# Patient Record
Sex: Male | Born: 1935 | Race: White | Hispanic: No | Marital: Married | Smoking: Never smoker
Health system: Southern US, Community
[De-identification: ages and names within clinical notes are randomized; demographics above are authoritative.]

## PROBLEM LIST (undated history)

## (undated) DIAGNOSIS — E782 Mixed hyperlipidemia: Secondary | ICD-10-CM

## (undated) DIAGNOSIS — M869 Osteomyelitis, unspecified: Secondary | ICD-10-CM

## (undated) DIAGNOSIS — I472 Ventricular tachycardia, unspecified: Secondary | ICD-10-CM

## (undated) DIAGNOSIS — I219 Acute myocardial infarction, unspecified: Secondary | ICD-10-CM

## (undated) DIAGNOSIS — E7800 Pure hypercholesterolemia, unspecified: Secondary | ICD-10-CM

## (undated) DIAGNOSIS — I10 Essential (primary) hypertension: Secondary | ICD-10-CM

## (undated) DIAGNOSIS — M86669 Other chronic osteomyelitis, unspecified tibia and fibula: Secondary | ICD-10-CM

## (undated) DIAGNOSIS — Z951 Presence of aortocoronary bypass graft: Secondary | ICD-10-CM

## (undated) DIAGNOSIS — E119 Type 2 diabetes mellitus without complications: Secondary | ICD-10-CM

## (undated) DIAGNOSIS — I255 Ischemic cardiomyopathy: Secondary | ICD-10-CM

## (undated) DIAGNOSIS — I350 Nonrheumatic aortic (valve) stenosis: Secondary | ICD-10-CM

## (undated) HISTORY — DX: Ischemic cardiomyopathy: I25.5

## (undated) HISTORY — DX: Acute myocardial infarction, unspecified: I21.9

## (undated) HISTORY — DX: Osteomyelitis, unspecified: M86.9

## (undated) HISTORY — DX: Ventricular tachycardia, unspecified: I47.20

## (undated) HISTORY — DX: Nonrheumatic aortic (valve) stenosis: I35.0

## (undated) HISTORY — DX: Ventricular tachycardia: I47.2

## (undated) HISTORY — DX: Other chronic osteomyelitis, unspecified tibia and fibula: M86.669

## (undated) HISTORY — DX: Essential (primary) hypertension: I10

## (undated) HISTORY — PX: FOOT AMPUTATION THROUGH METATARSAL: SHX644

## (undated) HISTORY — DX: Presence of aortocoronary bypass graft: Z95.1

## (undated) HISTORY — DX: Mixed hyperlipidemia: E78.2

## (undated) HISTORY — DX: Type 2 diabetes mellitus without complications: E11.9

## (undated) HISTORY — DX: Pure hypercholesterolemia, unspecified: E78.00

---

## 1998-04-23 HISTORY — PX: CORONARY ARTERY BYPASS GRAFT: SHX141

## 1998-09-07 ENCOUNTER — Inpatient Hospital Stay (HOSPITAL_COMMUNITY): Admission: AD | Admit: 1998-09-07 | Discharge: 1998-09-16 | Payer: Self-pay | Admitting: Cardiovascular Disease

## 1998-09-12 ENCOUNTER — Encounter: Payer: Self-pay | Admitting: Cardiothoracic Surgery

## 1998-09-13 ENCOUNTER — Encounter: Payer: Self-pay | Admitting: Cardiothoracic Surgery

## 1998-09-14 ENCOUNTER — Encounter: Payer: Self-pay | Admitting: Cardiothoracic Surgery

## 2001-05-24 HISTORY — PX: CARDIAC CATHETERIZATION: SHX172

## 2001-06-06 ENCOUNTER — Ambulatory Visit (HOSPITAL_COMMUNITY): Admission: RE | Admit: 2001-06-06 | Discharge: 2001-06-07 | Payer: Self-pay | Admitting: Cardiovascular Disease

## 2004-07-28 ENCOUNTER — Ambulatory Visit: Payer: Self-pay | Admitting: Cardiovascular Disease

## 2004-08-11 ENCOUNTER — Ambulatory Visit: Payer: Self-pay

## 2005-03-01 ENCOUNTER — Ambulatory Visit: Payer: Self-pay | Admitting: Infectious Diseases

## 2005-03-01 ENCOUNTER — Inpatient Hospital Stay (HOSPITAL_COMMUNITY): Admission: EM | Admit: 2005-03-01 | Discharge: 2005-03-08 | Payer: Self-pay | Admitting: Emergency Medicine

## 2005-03-02 ENCOUNTER — Ambulatory Visit: Payer: Self-pay | Admitting: Internal Medicine

## 2005-03-09 ENCOUNTER — Encounter (HOSPITAL_BASED_OUTPATIENT_CLINIC_OR_DEPARTMENT_OTHER): Admission: RE | Admit: 2005-03-09 | Discharge: 2005-04-20 | Payer: Self-pay | Admitting: Surgery

## 2005-04-20 ENCOUNTER — Encounter (HOSPITAL_BASED_OUTPATIENT_CLINIC_OR_DEPARTMENT_OTHER): Admission: RE | Admit: 2005-04-20 | Discharge: 2005-05-09 | Payer: Self-pay | Admitting: Surgery

## 2005-05-10 ENCOUNTER — Encounter (HOSPITAL_BASED_OUTPATIENT_CLINIC_OR_DEPARTMENT_OTHER): Admission: RE | Admit: 2005-05-10 | Discharge: 2005-06-26 | Payer: Self-pay | Admitting: Surgery

## 2007-01-28 ENCOUNTER — Encounter: Admission: RE | Admit: 2007-01-28 | Discharge: 2007-01-28 | Payer: Self-pay | Admitting: Family Medicine

## 2007-02-05 ENCOUNTER — Encounter: Admission: RE | Admit: 2007-02-05 | Discharge: 2007-02-05 | Payer: Self-pay | Admitting: Family Medicine

## 2008-05-15 ENCOUNTER — Emergency Department (HOSPITAL_COMMUNITY): Admission: EM | Admit: 2008-05-15 | Discharge: 2008-05-15 | Payer: Self-pay | Admitting: Emergency Medicine

## 2008-05-20 ENCOUNTER — Encounter: Admission: RE | Admit: 2008-05-20 | Discharge: 2008-05-20 | Payer: Self-pay | Admitting: Orthopedic Surgery

## 2008-06-01 ENCOUNTER — Ambulatory Visit: Payer: Self-pay | Admitting: Cardiology

## 2008-06-01 ENCOUNTER — Inpatient Hospital Stay (HOSPITAL_COMMUNITY): Admission: AD | Admit: 2008-06-01 | Discharge: 2008-06-07 | Payer: Self-pay | Admitting: Orthopedic Surgery

## 2008-06-04 ENCOUNTER — Ambulatory Visit (HOSPITAL_BASED_OUTPATIENT_CLINIC_OR_DEPARTMENT_OTHER): Admission: RE | Admit: 2008-06-04 | Discharge: 2008-06-04 | Payer: Self-pay | Admitting: Orthopedic Surgery

## 2008-06-04 ENCOUNTER — Encounter (INDEPENDENT_AMBULATORY_CARE_PROVIDER_SITE_OTHER): Payer: Self-pay | Admitting: Orthopedic Surgery

## 2008-06-07 ENCOUNTER — Encounter (INDEPENDENT_AMBULATORY_CARE_PROVIDER_SITE_OTHER): Payer: Self-pay | Admitting: Orthopedic Surgery

## 2008-06-15 ENCOUNTER — Encounter (HOSPITAL_BASED_OUTPATIENT_CLINIC_OR_DEPARTMENT_OTHER): Admission: RE | Admit: 2008-06-15 | Discharge: 2008-09-13 | Payer: Self-pay | Admitting: General Surgery

## 2008-06-29 ENCOUNTER — Encounter: Payer: Self-pay | Admitting: Cardiovascular Disease

## 2008-06-29 DIAGNOSIS — I2589 Other forms of chronic ischemic heart disease: Secondary | ICD-10-CM

## 2008-06-29 DIAGNOSIS — M86669 Other chronic osteomyelitis, unspecified tibia and fibula: Secondary | ICD-10-CM | POA: Insufficient documentation

## 2008-06-29 DIAGNOSIS — Z951 Presence of aortocoronary bypass graft: Secondary | ICD-10-CM | POA: Insufficient documentation

## 2008-06-29 DIAGNOSIS — I359 Nonrheumatic aortic valve disorder, unspecified: Secondary | ICD-10-CM | POA: Insufficient documentation

## 2008-06-29 DIAGNOSIS — I472 Ventricular tachycardia: Secondary | ICD-10-CM

## 2008-06-29 DIAGNOSIS — E785 Hyperlipidemia, unspecified: Secondary | ICD-10-CM

## 2008-07-01 ENCOUNTER — Ambulatory Visit: Payer: Self-pay | Admitting: Cardiovascular Disease

## 2008-07-16 ENCOUNTER — Ambulatory Visit: Payer: Self-pay

## 2008-08-31 ENCOUNTER — Telehealth (INDEPENDENT_AMBULATORY_CARE_PROVIDER_SITE_OTHER): Payer: Self-pay | Admitting: *Deleted

## 2008-09-01 ENCOUNTER — Encounter (INDEPENDENT_AMBULATORY_CARE_PROVIDER_SITE_OTHER): Payer: Self-pay | Admitting: Orthopedic Surgery

## 2008-09-01 ENCOUNTER — Inpatient Hospital Stay (HOSPITAL_COMMUNITY): Admission: RE | Admit: 2008-09-01 | Discharge: 2008-09-04 | Payer: Self-pay | Admitting: Orthopedic Surgery

## 2009-05-18 ENCOUNTER — Encounter (INDEPENDENT_AMBULATORY_CARE_PROVIDER_SITE_OTHER): Payer: Self-pay | Admitting: *Deleted

## 2009-07-18 DIAGNOSIS — I219 Acute myocardial infarction, unspecified: Secondary | ICD-10-CM | POA: Insufficient documentation

## 2009-07-18 DIAGNOSIS — I10 Essential (primary) hypertension: Secondary | ICD-10-CM | POA: Insufficient documentation

## 2009-07-18 DIAGNOSIS — E7800 Pure hypercholesterolemia, unspecified: Secondary | ICD-10-CM

## 2009-07-18 DIAGNOSIS — E119 Type 2 diabetes mellitus without complications: Secondary | ICD-10-CM | POA: Insufficient documentation

## 2009-07-18 DIAGNOSIS — M869 Osteomyelitis, unspecified: Secondary | ICD-10-CM | POA: Insufficient documentation

## 2009-07-19 ENCOUNTER — Ambulatory Visit: Payer: Self-pay | Admitting: Cardiovascular Disease

## 2009-07-19 DIAGNOSIS — R0989 Other specified symptoms and signs involving the circulatory and respiratory systems: Secondary | ICD-10-CM

## 2009-08-05 ENCOUNTER — Ambulatory Visit: Payer: Self-pay

## 2009-08-05 ENCOUNTER — Encounter: Payer: Self-pay | Admitting: Cardiovascular Disease

## 2009-08-15 ENCOUNTER — Encounter (INDEPENDENT_AMBULATORY_CARE_PROVIDER_SITE_OTHER): Payer: Self-pay | Admitting: *Deleted

## 2009-10-11 ENCOUNTER — Telehealth: Payer: Self-pay | Admitting: Cardiovascular Disease

## 2009-10-12 ENCOUNTER — Ambulatory Visit: Payer: Self-pay | Admitting: Cardiovascular Disease

## 2010-04-06 ENCOUNTER — Ambulatory Visit: Payer: Self-pay | Admitting: Cardiovascular Disease

## 2010-05-25 NOTE — Assessment & Plan Note (Signed)
Summary: 6 mo f/u ./cy   Visit Type:  6 months follow up Primary Provider:  Dr. Lemar Wilson   History of Present Illness: Antonio Wilson is seen today for F/U of CAD with prev. CABG in 2000.  Known occlusion of RCA graft with stenting of the native distal RCA in 2010.  Also history of mild AS and left carotid bruit.  CRF's include elevated lipids and HTN.  No cardiac complaints.  Recent bilateral toe surgery by Dr Antonio Wilson.  Denies SSCP, palpitations, dyspnea or syncope.  Compliant with meds.  Needs f/U carotid duplex in 6 months. His EF has been hard to gage by echo but is likely 30-35%.  He has been euvolemic with no CHF  He is very depressed as his invalid wife has recently passed and frankly I don't think Antonio Wilson knows what to do with himself  Current Problems (verified): 1)  Carotid Bruit  (ICD-785.9) 2)  Ventricular Tachycardia  (ICD-427.1) 3)  Coronary Artery Bypass Graft, Hx of  (ICD-V45.81) 4)  Aortic Stenosis, Mild  (ICD-424.1) 5)  Cardiomyopathy, Ischemic  (ICD-414.8) 6)  Hyperlipidemia-mixed  (ICD-272.4) 7)  Hypercholesterolemia  (ICD-272.0) 8)  Myocardial Infarction  (ICD-410.90) 9)  Hypertension  (ICD-401.9) 10)  Dm  (ICD-250.00) 11)  Chronic Osteomyelitis, Lower Leg  (ICD-730.16) 12)  Osteomyelitis  (ICD-730.20)  Current Medications (verified): 1)  Altace 5 Mg Caps (Ramipril) .Marland Kitchen.. 1 Tab Daily 2)  Coreg 3.125 Mg Tabs (Carvedilol) .Marland Kitchen.. 1 Tab Two Times A Day 3)  Glimepiride 4 Mg Tabs (Glimepiride) .... Take 1 Tablet By Mouth Once A Day 4)  Lipitor 20 Mg Tabs (Atorvastatin Calcium) .Marland Kitchen.. 1 Tab At Bedtime 5)  Aspirin 81 Mg Tbec (Aspirin) .... Take One Tablet By Mouth Daily 6)  Metformin Hcl 500 Mg Xr24h-Tab (Metformin Hcl) .... Take 1 Tablet By Mouth Two Times A Day 7)  Fish Oil 500 Mg Caps (Omega-3 Fatty Acids) .... Take 1 Capsule By Mouth Two Times A Day 8)  Valacyclovir Hcl 500 Mg Tabs (Valacyclovir Hcl) .... One Tab By Mouth Once Daily 9)  Flax  Oil (Flaxseed (Linseed)) .... 2400mg   Daily 10)  Saw Palmetto 450 Mg Caps (Saw Palmetto (Serenoa Repens)) .... Take 1 Capsule By Mouth Once A Day 11)  Garlic Oil 1000 Mg Caps (Garlic) .... Take 1 Capsule By Mouth Once A Day 12)  Cvs B-Carotene [address] Unit Caps (Beta Carotene) .... Take 1 Capsule By Mouth Once A Day  Allergies (verified): No Known Drug Allergies  Past History:  Past Medical History: Last updated: 07/18/2009 Current Problems:  VENTRICULAR TACHYCARDIA (ICD-427.1) CORONARY ARTERY BYPASS GRAFT, HX OF (ICD-V45.81) AORTIC STENOSIS, MILD (ICD-424.1) CARDIOMYOPATHY, ISCHEMIC (ICD-414.8) HYPERLIPIDEMIA-MIXED (ICD-272.4) HYPERCHOLESTEROLEMIA (ICD-272.0) MYOCARDIAL INFARCTION (ICD-410.90) HYPERTENSION (ICD-401.9) DM (ICD-250.00) CHRONIC OSTEOMYELITIS, LOWER LEG (ICD-730.16) OSTEOMYELITIS (ICD-730.20)of right great toe  CAD/CABG:  08/1998 SVG D1, SVG OM, SVG RCA occluded by cath 2003 with stent native RCA Lima LAD Iscehmis cardiomyopathy  Past Surgical History: Last updated: 07/18/2009 CABG 2000  by Antonio Wilson heart cath 2/03 Transmetatarsal amputation.   Family History: Last updated: 07/18/08 Father:decease ca Mother:decease old age Siblings:unknown  Social History: Last updated: 07-18-08 Retired  Married wife lives in rest home Tobacco Use - No.  Alcohol Use - no Drug Use - no  Review of Systems       Denies fever, malais, weight loss, blurry vision, decreased visual acuity, cough, sputum, SOB, hemoptysis, pleuritic pain, palpitaitons, heartburn, abdominal pain, melena, lower extremity edema, claudication, or rash.   Vital Signs:  Patient profile:  77 year old male Height:      71 inches Weight:      165 pounds BMI:     23.10 Pulse rate:   60 / minute Pulse rhythm:   irregular Resp:     18 per minute BP sitting:   110 / 60  (left arm) Cuff size:   regular  Vitals Entered By: Antonio Goody, RN (April 06, 2010 10:11 AM)  Physical Exam  General:  Affect appropriate Healthy:   appears stated age HEENT: normal Neck supple with no adenopathy JVP normalbilateral bruits no thyromegaly Lungs clear with no wheezing and good diaphragmatic motion Heart:  S1/S2 mild AS  murmur,rub, gallop or click PMI normal Abdomen: benighn, BS positve, no tenderness, no AAA no bruit.  No HSM or HJR Distal pulses intact with no bruits No edema Neuro non-focal Skin warm and dry    Impression & Recommendations:  Problem # 1:  CAROTID BRUIT (ICD-785.9) Duplex in 6 months.  No TIA like symptoms  Problem # 2:  CORONARY ARTERY BYPASS GRAFT, HX OF (ICD-V45.81) Stable no angina  Problem # 3:  AORTIC STENOSIS, MILD (ICD-424.1) Consider F/U echo in a year Asymptomatic and not severe on exam His updated medication list for this problem includes:    Altace 5 Mg Caps (Ramipril) .Marland Kitchen... 1 tab daily    Coreg 3.125 Mg Tabs (Carvedilol) .Marland Kitchen... 1 tab two times a day  Problem # 4:  CARDIOMYOPATHY, ISCHEMIC (ICD-414.8) Euvolemic functional class 2.  Orthopedic and neuro limitations to ambulation more than breathing His updated medication list for this problem includes:    Altace 5 Mg Caps (Ramipril) .Marland Kitchen... 1 tab daily    Coreg 3.125 Mg Tabs (Carvedilol) .Marland Kitchen... 1 tab two times a day    Aspirin 81 Mg Tbec (Aspirin) .Marland Kitchen... Take one tablet by mouth daily  Patient Instructions: 1)  Your physician wants you to follow-up in:6 MONTHS   You will receive a reminder letter in the mail two months in advance. If you don't receive a letter, please call our office to schedule the follow-up appointment.

## 2010-05-25 NOTE — Progress Notes (Signed)
Summary: Tingling in hands and legs  Phone Note Call from Patient Call back at Home Phone [phone]   Caller: Patient Summary of Call: Pt having tingling in hands and legs Initial call taken by: Judie Grieve,  October 11, 2009 1:35 PM  Follow-up for Phone Call        Pt awoke this am with tingling in hands and feet. This lasted a few minutes and he has had no more episodes.  Wants to see Dr. Eden Emms to discuss this.  Appt. made for June 22, 11 at 10:45 AM. Lisabeth Devoid RN

## 2010-05-25 NOTE — Assessment & Plan Note (Signed)
Summary: rov/dfg   Primary Provider:  Dr. Lemar Livings  CC:  pt complains of numbness in hands and pt states he couldnot get out the bed because of dizziness.  History of Present Illness: Antonio Wilson is seen today for F/U of CAD with prev. CABG in 2000.  Known occlusion of RCA graft with stenting of the native distal RCA in 2010.  Also history of mild AS and left carotid bruit.  CRF's include elevated lipids and HTN.  No cardiac complaints.  Recent bilateral toe surgery by Dr Luiz Blare.  Denies SSCP, palpitations, dyspnea or syncope.  Compliant with meds.  Needs f/U carotid duplex in 6 months. His EF has been hard to gage by echo but is likely 30-35%.  He has been euvolemic with no CHF He is very anxious and cares for his invalid wife.  He has had some lower extremithy parasthesias and may have some lumbar disc problems.  He just had a carotid duplex for left bruit and has no significant ICA stenosis.  He had tingling in his upper extremities with no other focal neuro signs.  I reassured him that he was not having a stroke and told him to F/U with Dr Nathanial Rancher for his back and legs  Current Problems (verified): 1)  Carotid Bruit  (ICD-785.9) 2)  Ventricular Tachycardia  (ICD-427.1) 3)  Coronary Artery Bypass Graft, Hx of  (ICD-V45.81) 4)  Aortic Stenosis, Mild  (ICD-424.1) 5)  Cardiomyopathy, Ischemic  (ICD-414.8) 6)  Hyperlipidemia-mixed  (ICD-272.4) 7)  Hypercholesterolemia  (ICD-272.0) 8)  Myocardial Infarction  (ICD-410.90) 9)  Hypertension  (ICD-401.9) 10)  Dm  (ICD-250.00) 11)  Chronic Osteomyelitis, Lower Leg  (ICD-730.16) 12)  Osteomyelitis  (ICD-730.20)  Current Medications (verified): 1)  Altace 5 Mg Caps (Ramipril) .Marland Kitchen.. 1 Tab Daily 2)  Coreg 3.125 Mg Tabs (Carvedilol) .Marland Kitchen.. 1 Tab Two Times A Day 3)  Glimepiride 4 Mg Tabs (Glimepiride) .... Take 1 Tablet By Mouth Once A Day 4)  Lipitor 20 Mg Tabs (Atorvastatin Calcium) .Marland Kitchen.. 1 Tab At Bedtime 5)  Aspirin 81 Mg Tbec (Aspirin) .... Take One Tablet  By Mouth Daily 6)  Metformin Hcl 500 Mg Xr24h-Tab (Metformin Hcl) .... Take 1 Tablet By Mouth Two Times A Day 7)  Fish Oil 500 Mg Caps (Omega-3 Fatty Acids) .... Take 1 Capsule By Mouth Two Times A Day 8)  Multivitamins  Tabs (Multiple Vitamin) .... Take 1 Tablet By Mouth Once A Day  Allergies (verified): No Known Drug Allergies  Past History:  Past Medical History: Last updated: 07/18/2009 Current Problems:  VENTRICULAR TACHYCARDIA (ICD-427.1) CORONARY ARTERY BYPASS GRAFT, HX OF (ICD-V45.81) AORTIC STENOSIS, MILD (ICD-424.1) CARDIOMYOPATHY, ISCHEMIC (ICD-414.8) HYPERLIPIDEMIA-MIXED (ICD-272.4) HYPERCHOLESTEROLEMIA (ICD-272.0) MYOCARDIAL INFARCTION (ICD-410.90) HYPERTENSION (ICD-401.9) DM (ICD-250.00) CHRONIC OSTEOMYELITIS, LOWER LEG (ICD-730.16) OSTEOMYELITIS (ICD-730.20)of right great toe  CAD/CABG:  08/1998 SVG D1, SVG OM, SVG RCA occluded by cath 2003 with stent native RCA Lima LAD Iscehmis cardiomyopathy  Past Surgical History: Last updated: 07/18/2009 CABG 2000  by Zenaida Niece Trigt heart cath 2/03 Transmetatarsal amputation.   Family History: Last updated: 07/08/2008 Father:decease ca Mother:decease old age Siblings:unknown  Social History: Last updated: 2008-07-08 Retired  Married wife lives in rest home Tobacco Use - No.  Alcohol Use - no Drug Use - no  Review of Systems       Denies fever, malais, weight loss, blurry vision, decreased visual acuity, cough, sputum, SOB, hemoptysis, pleuritic pain, palpitaitons, heartburn, abdominal pain, melena, lower extremity edema, claudication, or rash.   Vital Signs:  Patient profile:  77 year old male Height:      71 inches Weight:      159 pounds BMI:     22.26 Pulse rate:   60 / minute Resp:     18 per minute BP sitting:   118 / 67  (left arm)  Vitals Entered By: Kem Parkinson (October 12, 2009 10:42 AM)  Physical Exam  General:  Affect appropriate Healthy:  appears stated age HEENT: normal Neck supple  with no adenopathy JVP normal left  bruits no thyromegaly Lungs clear with no wheezing and good diaphragmatic motion Heart:  S1/S2 systolic  murmur, no rub, gallop or click PMI normal Abdomen: benighn, BS positve, no tenderness, no AAA no bruit.  No HSM or HJR Distal pulses intact with no bruits No edema Neuro non-focal Skin warm and dry    Impression & Recommendations:  Problem # 1:  CAROTID BRUIT (ICD-785.9) No stenosis by duplex continue ASA.  Upper arm tingling not likely TIA.  No further w.u  Problem # 2:  CORONARY ARTERY BYPASS GRAFT, HX OF (ICD-V45.81) Stable no angina  Problem # 3:  AORTIC STENOSIS, MILD (ICD-424.1) Mild with no recent progression.  F/U echo in a year His updated medication list for this problem includes:    Altace 5 Mg Caps (Ramipril) .Marland Kitchen... 1 tab daily    Coreg 3.125 Mg Tabs (Carvedilol) .Marland Kitchen... 1 tab two times a day  Problem # 4:  HYPERLIPIDEMIA-MIXED (ICD-272.4)  Labs with Dr Nathanial Rancher.  No myalgias His updated medication list for this problem includes:    Lipitor 20 Mg Tabs (Atorvastatin calcium) .Marland Kitchen... 1 tab at bedtime  His updated medication list for this problem includes:    Lipitor 20 Mg Tabs (Atorvastatin calcium) .Marland Kitchen... 1 tab at bedtime  Patient Instructions: 1)  Your physician recommends that you schedule a follow-up appointment in: 6 MONTHS WITH DR Eden Emms 2)  Your physician recommends that you continue on your current medications as directed. Please refer to the Current Medication list given to you today.

## 2010-05-25 NOTE — Letter (Signed)
Summary: Generic Letter  [employer], Main Office  [address]   [place], [place] [address]   Phone: [phone]  Fax: [phone]        August 15, 2009 MRN: [id number]    [employer] [address] [place], [place]  [address]    Dear Mr. Hintz,       I have been unable to reach you by phone to let you know the results of your ultrasound. We checked the arteries in your neck to make sure there was no blockage and everything is fine. You do have 0-39% blockage on both sides of your neck in the carotid arteries, but that is considered normal. We may repeat this scan in the future to make sure the blockage does not change, but for right now everything is normal. Please call with any questions or concerns.   Sincerely,  Deliah Goody, RN/Dr Charlton Haws  This letter has been electronically signed by your physician.

## 2010-05-25 NOTE — Assessment & Plan Note (Signed)
Summary: yearly/sl  Medications Added GLIMEPIRIDE 4 MG TABS (GLIMEPIRIDE) Take 1 tablet by mouth once a day ASPIRIN 81 MG TBEC (ASPIRIN) Take one tablet by mouth daily METFORMIN HCL 500 MG XR24H-TAB (METFORMIN HCL) Take 1 tablet by mouth two times a day FISH OIL 500 MG CAPS (OMEGA-3 FATTY ACIDS) Take 1 capsule by mouth two times a day MULTIVITAMINS  TABS (MULTIPLE VITAMIN) Take 1 tablet by mouth once a day      Allergies Added: NKDA  Visit Type:  1 year follow up  CC:  No complains.  History of Present Illness: Antonio Wilson is seen today for F/U of CAD with prev. CABG in 2000.  Known occlusion of RCA graft with stenting of the native distal RCA in 2010.  Also history of mild AS and left carotid bruit.  CRF's include elevated lipids and HTN.  No cardiac complaints.  Recent bilateral toe surgery by Dr Luiz Blare.  Denies SSCP, palpitations, dyspnea or syncope.  Compliant with meds.  Needs f/U carotid duplex in 6 months. His EF has been hard to gage by echo but is likely 30-35%.  He has been euvolemic with no CHF  Current Problems (verified): 1)  Carotid Bruit  (ICD-785.9) 2)  Ventricular Tachycardia  (ICD-427.1) 3)  Coronary Artery Bypass Graft, Hx of  (ICD-V45.81) 4)  Aortic Stenosis, Mild  (ICD-424.1) 5)  Cardiomyopathy, Ischemic  (ICD-414.8) 6)  Hyperlipidemia-mixed  (ICD-272.4) 7)  Hypercholesterolemia  (ICD-272.0) 8)  Myocardial Infarction  (ICD-410.90) 9)  Hypertension  (ICD-401.9) 10)  Dm  (ICD-250.00) 11)  Chronic Osteomyelitis, Lower Leg  (ICD-730.16) 12)  Osteomyelitis  (ICD-730.20)  Current Medications (verified): 1)  Altace 5 Mg Caps (Ramipril) .Marland Kitchen.. 1 Tab Daily 2)  Coreg 3.125 Mg Tabs (Carvedilol) .Marland Kitchen.. 1 Tab Two Times A Day 3)  Glimepiride 4 Mg Tabs (Glimepiride) .... Take 1 Tablet By Mouth Once A Day 4)  Lipitor 20 Mg Tabs (Atorvastatin Calcium) .Marland Kitchen.. 1 Tab At Bedtime 5)  Aspirin 81 Mg Tbec (Aspirin) .... Take One Tablet By Mouth Daily 6)  Metformin Hcl 500 Mg Xr24h-Tab  (Metformin Hcl) .... Take 1 Tablet By Mouth Two Times A Day 7)  Fish Oil 500 Mg Caps (Omega-3 Fatty Acids) .... Take 1 Capsule By Mouth Two Times A Day 8)  Multivitamins  Tabs (Multiple Vitamin) .... Take 1 Tablet By Mouth Once A Day  Allergies (verified): No Known Drug Allergies  Past History:  Past Medical History: Last updated: 07/18/2009 Current Problems:  VENTRICULAR TACHYCARDIA (ICD-427.1) CORONARY ARTERY BYPASS GRAFT, HX OF (ICD-V45.81) AORTIC STENOSIS, MILD (ICD-424.1) CARDIOMYOPATHY, ISCHEMIC (ICD-414.8) HYPERLIPIDEMIA-MIXED (ICD-272.4) HYPERCHOLESTEROLEMIA (ICD-272.0) MYOCARDIAL INFARCTION (ICD-410.90) HYPERTENSION (ICD-401.9) DM (ICD-250.00) CHRONIC OSTEOMYELITIS, LOWER LEG (ICD-730.16) OSTEOMYELITIS (ICD-730.20)of right great toe  CAD/CABG:  08/1998 SVG D1, SVG OM, SVG RCA occluded by cath 2003 with stent native RCA Lima LAD Iscehmis cardiomyopathy  Past Surgical History: Last updated: 07/18/2009 CABG 2000  by Zenaida Niece Trigt heart cath 2/03 Transmetatarsal amputation.   Family History: Last updated: 07-Jul-2008 Father:decease ca Mother:decease old age Siblings:unknown  Social History: Last updated: 2008/07/07 Retired  Married wife lives in rest home Tobacco Use - No.  Alcohol Use - no Drug Use - no  Review of Systems       Denies fever, malais, weight loss, blurry vision, decreased visual acuity, cough, sputum, SOB, hemoptysis, pleuritic pain, palpitaitons, heartburn, abdominal pain, melena, lower extremity edema, claudication, or rash.   Vital Signs:  Patient profile:   77 year old male Height:      71 inches  Weight:      166 pounds BMI:     23.24 Pulse rate:   74 / minute Pulse rhythm:   irregular Resp:     18 per minute BP sitting:   130 / 60  (left arm) Cuff size:   large  Vitals Entered By: Vikki Ports (July 19, 2009 1:41 PM)  Physical Exam  General:  Affect appropriate Healthy:  appears stated age HEENT: normal Neck supple with no  adenopathy JVP normal  bilateral  bruits no thyromegaly Lungs clear with no wheezing and good diaphragmatic motion Heart:  S1/S2 no murmur,rub, gallop or click PMI normal Abdomen: benighn, BS positve, no tenderness, no AAA no bruit.  No HSM or HJR Distal pulses intact with no bruits No edema Neuro non-focal Skin warm and dry    Impression & Recommendations:  Problem # 1:  CAROTID BRUIT (ICD-785.9) F/U duplex in 6 monnts continue antiplatlet Rx Orders: Carotid Duplex (Carotid Duplex)  Problem # 2:  CORONARY ARTERY BYPASS GRAFT, HX OF (ICD-V45.81) Stable no angina.  Continue medical Rx.  Known occlusion of RCA graft with native vessel stenting  Problem # 3:  AORTIC STENOSIS, MILD (ICD-424.1) Mild but low EF.  F/U echo in 6 months His updated medication list for this problem includes:    Altace 5 Mg Caps (Ramipril) .Marland Kitchen... 1 tab daily    Coreg 3.125 Mg Tabs (Carvedilol) .Marland Kitchen... 1 tab two times a day  Problem # 4:  HYPERLIPIDEMIA-MIXED (ICD-272.4) Labs in 6 months no myalgias His updated medication list for this problem includes:    Lipitor 20 Mg Tabs (Atorvastatin calcium) .Marland Kitchen... 1 tab at bedtime  Patient Instructions: 1)  Your physician recommends that you schedule a follow-up appointment in: 6 MONTHS 2)  Your physician has requested that you have a carotid duplex. This test is an ultrasound of the carotid arteries in your neck. It looks at blood flow through these arteries that supply the brain with blood. Allow one hour for this exam. There are no restrictions or special instructions.   EKG Report  Procedure date:  07/19/2009  Findings:      SR 74 PVC's LAD ST/T wave changes consider lateral ischemia No change from prev

## 2010-05-25 NOTE — Letter (Signed)
Summary: Appointment - Reminder 2  [employer], Main Office  [address]   [place], [place] [address]   Phone: [phone]  Fax: [phone]     May 18, 2009 MRN: [id number]   Antonio Wilson [address] [place], [place]  [address]   Dear Mr. Kreisler,  Our records indicate that it is time to schedule a follow-up appointment with Dr. Eden Emms. It is very important that we reach you to schedule this appointment. We look forward to participating in your health care needs. Please contact us at the number listed above at your earliest convenience to schedule your appointment.  If you are unable to make an appointment at this time, give [place] a call so we can update our records.   Sincerely,   [facility] Scheduling Team

## 2010-08-01 LAB — CBC
HCT: 36 % — ABNORMAL LOW (ref 39.0–52.0)
Hemoglobin: 12.2 g/dL — ABNORMAL LOW (ref 13.0–17.0)
MCHC: 33.8 g/dL (ref 30.0–36.0)
MCV: 88.8 fL (ref 78.0–100.0)
Platelets: 205 10*3/uL (ref 150–400)
RBC: 4.05 MIL/uL — ABNORMAL LOW (ref 4.22–5.81)
RDW: 14.8 % (ref 11.5–15.5)
WBC: 6.2 10*3/uL (ref 4.0–10.5)

## 2010-08-01 LAB — GLUCOSE, CAPILLARY
Glucose-Capillary: 101 mg/dL — ABNORMAL HIGH (ref 70–99)
Glucose-Capillary: 111 mg/dL — ABNORMAL HIGH (ref 70–99)
Glucose-Capillary: 118 mg/dL — ABNORMAL HIGH (ref 70–99)
Glucose-Capillary: 119 mg/dL — ABNORMAL HIGH (ref 70–99)
Glucose-Capillary: 121 mg/dL — ABNORMAL HIGH (ref 70–99)
Glucose-Capillary: 122 mg/dL — ABNORMAL HIGH (ref 70–99)
Glucose-Capillary: 136 mg/dL — ABNORMAL HIGH (ref 70–99)
Glucose-Capillary: 148 mg/dL — ABNORMAL HIGH (ref 70–99)
Glucose-Capillary: 157 mg/dL — ABNORMAL HIGH (ref 70–99)
Glucose-Capillary: 75 mg/dL (ref 70–99)
Glucose-Capillary: 89 mg/dL (ref 70–99)
Glucose-Capillary: 98 mg/dL (ref 70–99)
Glucose-Capillary: 98 mg/dL (ref 70–99)

## 2010-08-01 LAB — BASIC METABOLIC PANEL
BUN: 14 mg/dL (ref 6–23)
CO2: 28 mEq/L (ref 19–32)
Calcium: 8.6 mg/dL (ref 8.4–10.5)
Chloride: 103 mEq/L (ref 96–112)
Creatinine, Ser: 0.71 mg/dL (ref 0.4–1.5)
GFR calc Af Amer: >60 mL/min (ref >60)
GFR calc non Af Amer: >60 mL/min (ref >60)
Glucose, Bld: 101 mg/dL — ABNORMAL HIGH (ref 70–99)
Potassium: 4.6 mEq/L (ref 3.5–5.1)
Sodium: 137 mEq/L (ref 135–145)

## 2010-08-07 LAB — BASIC METABOLIC PANEL
BUN: 17 mg/dL (ref 6–23)
Chloride: 101 mEq/L (ref 96–112)
Glucose, Bld: 269 mg/dL — ABNORMAL HIGH (ref 70–99)
Potassium: 4.5 mEq/L (ref 3.5–5.1)

## 2010-08-07 LAB — POCT I-STAT, CHEM 8
Calcium, Ion: 1.1 mmol/L — ABNORMAL LOW (ref 1.12–1.32)
Chloride: 101 mEq/L (ref 96–112)
Creatinine, Ser: 1.1 mg/dL (ref 0.4–1.5)
Glucose, Bld: 160 mg/dL — ABNORMAL HIGH (ref 70–99)
Potassium: 4.5 mEq/L (ref 3.5–5.1)

## 2010-08-07 LAB — DIFFERENTIAL
Eosinophils Absolute: 0 10*3/uL (ref 0.0–0.7)
Lymphs Abs: 1.2 10*3/uL (ref 0.7–4.0)
Monocytes Absolute: 0.7 10*3/uL (ref 0.1–1.0)
Monocytes Relative: 9 % (ref 3–12)
Neutro Abs: 5.3 10*3/uL (ref 1.7–7.7)
Neutrophils Relative %: 74 % (ref 43–77)

## 2010-08-07 LAB — CBC
Hemoglobin: 12.6 g/dL — ABNORMAL LOW (ref 13.0–17.0)
MCV: 90.9 fL (ref 78.0–100.0)
RBC: 4.19 MIL/uL — ABNORMAL LOW (ref 4.22–5.81)
WBC: 7.2 10*3/uL (ref 4.0–10.5)

## 2010-08-08 LAB — GLUCOSE, CAPILLARY
Glucose-Capillary: 123 mg/dL — ABNORMAL HIGH (ref 70–99)
Glucose-Capillary: 124 mg/dL — ABNORMAL HIGH (ref 70–99)
Glucose-Capillary: 139 mg/dL — ABNORMAL HIGH (ref 70–99)
Glucose-Capillary: 149 mg/dL — ABNORMAL HIGH (ref 70–99)
Glucose-Capillary: 159 mg/dL — ABNORMAL HIGH (ref 70–99)
Glucose-Capillary: 159 mg/dL — ABNORMAL HIGH (ref 70–99)
Glucose-Capillary: 168 mg/dL — ABNORMAL HIGH (ref 70–99)
Glucose-Capillary: 179 mg/dL — ABNORMAL HIGH (ref 70–99)
Glucose-Capillary: 273 mg/dL — ABNORMAL HIGH (ref 70–99)
Glucose-Capillary: 57 mg/dL — ABNORMAL LOW (ref 70–99)
Glucose-Capillary: 93 mg/dL (ref 70–99)
Glucose-Capillary: 93 mg/dL (ref 70–99)

## 2010-08-08 LAB — CBC
HCT: 34.3 % — ABNORMAL LOW (ref 39.0–52.0)
HCT: 35.7 % — ABNORMAL LOW (ref 39.0–52.0)
HCT: 35.9 % — ABNORMAL LOW (ref 39.0–52.0)
Hemoglobin: 12.4 g/dL — ABNORMAL LOW (ref 13.0–17.0)
MCHC: 34.2 g/dL (ref 30.0–36.0)
MCHC: 34.8 g/dL (ref 30.0–36.0)
MCV: 88.7 fL (ref 78.0–100.0)
MCV: 89.3 fL (ref 78.0–100.0)
Platelets: 172 10*3/uL (ref 150–400)
Platelets: 173 10*3/uL (ref 150–400)
RBC: 3.86 MIL/uL — ABNORMAL LOW (ref 4.22–5.81)
RBC: 4.03 MIL/uL — ABNORMAL LOW (ref 4.22–5.81)
RDW: 13.6 % (ref 11.5–15.5)
WBC: 6.8 10*3/uL (ref 4.0–10.5)
WBC: 7.2 10*3/uL (ref 4.0–10.5)

## 2010-08-08 LAB — BASIC METABOLIC PANEL
BUN: 11 mg/dL (ref 6–23)
BUN: 11 mg/dL (ref 6–23)
CO2: 27 mEq/L (ref 19–32)
Chloride: 102 mEq/L (ref 96–112)
Chloride: 98 mEq/L (ref 96–112)
Creatinine, Ser: 0.73 mg/dL (ref 0.4–1.5)
Creatinine, Ser: 0.73 mg/dL (ref 0.4–1.5)
GFR calc Af Amer: >60 mL/min (ref >60)
GFR calc non Af Amer: >60 mL/min (ref >60)
Glucose, Bld: 153 mg/dL — ABNORMAL HIGH (ref 70–99)
Potassium: 4 mEq/L (ref 3.5–5.1)
Potassium: 4.1 mEq/L (ref 3.5–5.1)
Potassium: 4.4 mEq/L (ref 3.5–5.1)
Sodium: 129 mEq/L — ABNORMAL LOW (ref 135–145)

## 2010-08-08 LAB — WOUND CULTURE

## 2010-08-08 LAB — BRAIN NATRIURETIC PEPTIDE: Pro B Natriuretic peptide (BNP): 314 pg/mL — ABNORMAL HIGH (ref 0.0–100.0)

## 2010-08-08 LAB — SEDIMENTATION RATE: Sed Rate: 53 mm/hr — ABNORMAL HIGH (ref 0–16)

## 2010-09-05 NOTE — Consult Note (Signed)
NAME:  Antonio Wilson, Antonio Wilson NO.:  [id number]   MEDICAL RECORD NO.:  [id number]          PATIENT TYPE:  REC   LOCATION:  FOOT                         FACILITY:  [facility]   PHYSICIAN:  Jonelle Sports. Sevier, M.D. DATE OF BIRTH:  [birth year]   DATE OF CONSULTATION:  06/16/2008  DATE OF DISCHARGE:                                 CONSULTATION   HISTORY OF PRESENT ILLNESS:  This is a 77 year old white male who has  previously been seen at this clinic.  He has type 2 diabetes and  significant coronary disease.   He had developed an infection in his right hallux with penetration down  to the bone and with osteomyelitis in that toe as well as possibly in  the MP joint.  This led to his hospitalization and preparation with  antibiotics and so forth leading up to eventual ray amputation of the  first ray of the right lower extremity on June 04, 2008.   Since that time, he has remained on Augmentin XR and has had a  reasonably satisfactory progressive healing of the wound.   He is referred here apparently for our ongoing management in  anticipation of the fact that this wound will take quite a while  completely to heal.   PAST MEDICAL HISTORY:  Surgeries include coronary artery bypass grafting  in 2000 and the surgery alluded in the present illness.  He has had  numerous other hospitalizations in association with his coronary disease  which have included some episodes of nonsustained ventricular  tachycardia and percutaneous intervention to his coronary arteries with  stent placement.   ALLERGIES:  None known.   REGULAR MEDICATIONS:  1. Aspirin 81 mg daily.  2. Metformin 500 mg b.i.d.  3. Glimepiride 4 mg daily.  4. Carvedilol 3.125 mg b.i.d.  5. Lipitor 20 mg daily.  6. Ramipril 5 mg daily.  7. Beta-carotene 25,000 international units daily.  8. Currently Augmentin XR 875 mg b.i.d. before his surgery.   PERSONAL HISTORY AND FAMILY HISTORY:  Not reviewed in great detail  at  this time.  The patient is married.  Does not smoke, use alcohol, or  recreational drugs.  He is able to walk without assistance and take care  of his own personal needs at home.   REVIEW OF SYSTEMS:  Positive for hypertension, for coronary disease as  previously indicated, for diabetes with peripheral neuropathy, and for  history of lens implants.   PHYSICAL EXAMINATION:  VITAL SIGNS:  Blood pressure is 96/62 which he  says is pretty typical for him on his current medication regimen, pulse  is 79 and irregular, respirations are 18, and his temperature is 97.7.  GENERAL:  He is thin and in no cardiorespiratory distress.  EXTREMITIES:  His left lower extremity is remarkable primarily for  extremely dry skin on the foot.   His right lower extremity is characterized by the surgical scar of the  right ray amputation.  In that foot with spreading erythema around the  surgical wound and with some dehiscence of the surgical line, even  though sutures are  still in place.  Distally in this surgical site is a  small open area measuring 2.5 x 1.8 x 0.2 cm and the remainder of the  suture line that is open measures approximately 5.5 cm.  There is loose  slough throughout the length of this wound.  There is no significant  odor.  There is no fluctuance.  There is no tenderness.   The pulses in this foot are very easily palpable and on Doppler  examination, he has good biphasic signal in the dorsalis pedis and a  triphasic signal in the posterior tibial.   IMPRESSION:  1. Hypertensive arteriosclerotic cardiovascular disease with      remarkably good circulation, right lower extremity.  2. Diabetes mellitus with diabetic foot ulcer status post first ray      amputation right lower extremity with nonhealing wound at the      surgical site.   DISPOSITION:  The most proximal of the wound suture is removed as this  area appears to have healed.  The remainder of the sutures are left in  place,  saved one which had exuded itself from the wound and is removed.  The wound is debrided of the slough which has occurred along its margins  and also the foot is debrided to have a good bit of calloused dry skin.  The wound was then cultured.   It will be treated with an application of Neosporin nonstick dressing  and a bulky foot dressing.  He will be placed in a healing sandal.   He is advised to continue his Augmentin twice daily as at present.   Followup visit will be here in 1 week.           ______________________________  Jonelle Sports. Cheryll Cockayne, M.D.     RES/MEDQ  D:  06/16/2008  T:  06/16/2008  Job:  [id number]   cc:   Harvie Junior, M.D.  Noralyn Pick. Eden Emms, MD, [facility]

## 2010-09-05 NOTE — Assessment & Plan Note (Signed)
[facility]   NAME:  Antonio Wilson, Antonio Wilson NO.:  [id number]   MEDICAL RECORD NO.:  [id number]      DATE OF BIRTH:  1929-06-27   PHYSICIAN:  Maxwell Caul, M.D. VISIT DATE:  08/12/2008                                   OFFICE VISIT   Antonio Wilson is a 77 year old man with a nonhealing surgical wound at the  site of the first ray amputation on the right lower extremity.  He also  has another wound on the plantar aspect of his right third toe.  He has  been treated with antibacterial soap washes, Neosporin, dry dressing,  and he is offloading this in a Darco healing wedge.   On examination, his temperature is 97.4.  He has a linear wound at the  incision site.  This was lightly debrided of some surface eschar.  Underneath the base of this appears to be healthy.  The wound on the  right second toe was also lightly debrided of some circumferential  callus and surface slough.   IMPRESSION:  1. Surgical wound, nonhealing at the site of her first ray amputation.      We underwent a debridement of this as noted above.  This is healthy      and I think this will close over.  2. New ulcer on the plantar aspect of his right third toe.  This was      debrided.  I would like to have put a collagen-based dressing on      both of these; however, he seemed reluctant to do this and as      things appear to be improving, we continued with a Neosporin and      dry dressing.  We will continue to offload this in the Darco      healing wedge.  We will see him again next week.           ______________________________  Maxwell Caul, M.D.     MGR/MEDQ  D:  08/12/2008  T:  08/13/2008  Job:  [id number]

## 2010-09-05 NOTE — Discharge Summary (Signed)
NAME:  Antonio Wilson, DIRR NO.:  [id number]   MEDICAL RECORD NO.:  [id number]          PATIENT TYPE:  INP   LOCATION:  5023                         FACILITY:  [facility]   PHYSICIAN:  Harvie Junior, M.D.   DATE OF BIRTH:  01/23/1930   DATE OF ADMISSION:  06/01/2008  DATE OF DISCHARGE:  06/07/2008                               DISCHARGE SUMMARY   ADMITTING DIAGNOSES:  1. Infection, right great toe with osteomyelitis.  2. Diabetes mellitus.  3. Hypertension.  4. History of myocardial infarction.  5. Hyperlipidemia.   DISCHARGE DIAGNOSES:  1. Infection, right great toe with osteomyelitis.  2. Diabetes mellitus.  3. Hypertension.  4. History of myocardial infarction.  5. Hyperlipidemia.   CONSULTATIONS:  Fransisco Hertz, MD, Infectious Disease   PROCEDURES IN HOSPITAL:  Right great toe amputation with partial first  ray amputation, right foot on June 04, 2008 by Jodi Geralds, MD.   BRIEF HISTORY:  Antonio Wilson is a 77 year old male who has a history of  diabetes.  He has a lengthy history of problems with his right great toe  with a chronic right great toe infection.  History showed x-rays that  confirmed osteomyelitis of the right great toe.  He had a draining  swollen red odoriferous right great toe with redness that may be  approximately past the first MTP joint into the first ray area.  The  patient presented to our office and we have been followed him after  being on oral Cipro and continued to have pains and swelling of his foot  with fevers.  He was admitted to the hospital for administration of IV  antibiotics and possible right great toe amputation and/or first ray  amputation as deemed necessary.  The patient was admitted for this.   PERTINENT LABORATORY STUDIES:  EKG on June 03, 2008 showed normal  sinus rhythm, left axis deviation, nonspecific intraventricular  conduction block, rule out septal infarct, no significant change since  previous tracing,  May 20, 2008.  Chest x-ray showed a stable chest x-  ray with no active lung disease on June 03, 2008.  On June 07, 2008, he had a transthoracic electrocardiogram which showed dyssynergy  of the septum.  Apparently there were limited images and study was  inadequate for evaluation of left ventricular regional wall motion.  The  left ventricular wall thickness was at the upper limits of normal.  Aortic valve thickness was mildly increased.  There was also mildly  reduced aortic valve leaflet excursion.  Left atrial size was at the  upper limits of normal.  Hemoglobin on admission was 12.4, hematocrit  35.9, and WBC 6.8 on June 02, 2008.  On June 04, 2008,  hemoglobin was 11.9 with WBC of 7.2.  On June 07, 2008, the  hemoglobin was 11.9 with a WBC of 10.4.  Sed rate was 53 mm on June 02, 2008.  CMET showed elevated glucose up to 153, but otherwise was  within normal limits.  He had good renal function.  B-natriuretic  peptide was 314.  Gram-stain showed  abundant Gram-positive cocci in  pairs and clusters.  Wound cultures of right foot showed abundant  Proteus mirabilis with moderate methicillin-resistant staph aureus.  Since these were noted in the chart, there were no anaerobes isolated.  His CBGs during this hospitalization ran from 223 down to 93.   HOSPITAL COURSE:  Mr. Boylen was admitted to the hospital for a right  foot infection with confirmed osteomyelitis.  He was put on IV  antibiotics, elevation and a K-pad.  He was started on Cipro and  clindamycin.  He was without complaints.  He did have a temperature of  100.6.  On postop day #1, his right great toe appeared grossly infected  with an open draining wound and erythema running in the midfoot.  His  laboratory data was overall stable.  It was felt that he was going to  need a great toe amputation and partial first ray amputation.  We want  to get the foot settle down preoperatively.  Preop clearance  was  suggested per Anesthesia.  The patient had no chest pain or shortness of  breath.  Chest x-ray was stable and excellent with no active disease.  His right great toe was still odoriferous and draining and red.  [facility] evaluated this patient and felt he was at moderate to high  risk for cardiac event under general anesthesia.  Ankle block was  suggested.  The patient was felt to be okay for the OR with a regional  block.  On postop day #1, he was without complaints.  He states he has  an asensate foot, so he really could not feel it anyway.  His dressing  was changed and his foot wound looked good.  He was gotten out of bed to  chair with the foot elevated.  He continued to have a postop shoe.  IV  antibiotics were continued postoperatively.  [facility] felt  that the patient was stable from a cardiac viewpoint per Dr. Myrtis Ser.  On  postop day #2, he had a temperature up to 102 and he was then found be  on 100 degrees.  Vital signs were stable.  Incentive spirometry was  ordered and felt that the fever may be secondary to respiratory issues.  On postoperative day #3, he was doing well.  He said he wanted to go  home.  He was afebrile.  His vital signs were stable.  He had no chest  pain or shortness of breath.  Dr. Maurice March was called and he felt that with  this history and his cultures, Augmentin 875 mg b.i.d. x1 month was  indicated.  At the time of the discharge, his right foot wound had only  minimal drainage if any with minimal redness, no streaking into his  foot.  His cultures grew Proteus mirabilis but no anaerobes isolated.  He will need a Home Health RN for dressing changes every other day and  he will ambulate with a Darby shoe putting most of the weight on his  heel.  He will follow up with Dr. Luiz Blare in the office in 10 days and  the [facility] in 10 days.  Cardiology on a p.r.n. basis.      Marshia Ly, P.A.      Harvie Junior, M.D.   Electronically Signed    JB/MEDQ  D:  08/05/2008  T:  08/06/2008  Job:  [id number]   cc:   Burnell Blanks, MD  Luis Abed, MD, [facility]

## 2010-09-05 NOTE — Assessment & Plan Note (Signed)
[facility]   NAME:  Antonio Wilson, Antonio Wilson                 ACCOUNT NO.:  [id number]   MEDICAL RECORD NO.:  [id number]      DATE OF BIRTH:  05-16-1929   PHYSICIAN:  Jonelle Sports. Sevier, M.D.  VISIT DATE:  07/07/2008                                   OFFICE VISIT   HISTORY OF PRESENT ILLNESS:  This 77 year old white male is being  followed for a slow to heal surgical wound following the first ray  amputation on the right secondary to a diabetic ulcer.   When he was here last visit, he was supposed to be treated with Iodosorb  which was to be applied every other day, but he never obtained that and  there has been some confusion about his relationship with Home Health  Care, etc.  The patient is very hyperactive, appears to have ADD and is  erratic in his behavior and does not retain information and  recommendations very well.   He is seen today indicating that he feels his wound is doing  satisfactorily.  Certainly, he has had no fever, chills, or expected  symptoms.   PHYSICAL EXAMINATION:  Blood pressure 151/70, pulse 69, respirations 18,  and temperature 97.7.  His own fasting blood glucose this morning was  116 mg/dL.   The wound on the right hallucal amputation site now measures 4.8 x 1.0 x  0.2 cm and appears clean without spreading of inflammation, discharge,  or odor.  There is some slough in the wound base, scattered throughout,  particularly heavy at the more proximal end of the wound.   IMPRESSION:  Satisfactory course, slow to heal surgical wound, right  lower extremity.   DISPOSITION:  The wound is debrided of the aforementioned slough and  particularly in an area about 1 cm in from the proximal end of the  wound.  This debridement goes fairly deep.  There is some bleeding.  This is controlled by pressure.   The wound is then dressed with application of Iodosorb, covered with a  dry dressing, bulky forefoot wrap and he is placed in a healing sandal.  He  will be seen at home by the [facility] nurse who twice weekly will  cleanse the wound and dress it with Neosporin and a dry dressing.   Followup visit will be here in 2 weeks.           ______________________________  Jonelle Sports Cheryll Cockayne, M.D.     RES/MEDQ  D:  07/07/2008  T:  07/08/2008  Job:  [id number]

## 2010-09-05 NOTE — Assessment & Plan Note (Signed)
[facility]   NAME:  Antonio Wilson, Antonio Wilson                 ACCOUNT NO.:  [id number]   MEDICAL RECORD NO.:  [id number]      DATE OF BIRTH:  [birth year]   PHYSICIAN:  Lenon Curt. Chilton Si, M.D.   VISIT DATE:  08/20/2008                                   OFFICE VISIT   HISTORY:  A 77 year old man with nonhealing surgical wound site of the  first ray amputation on the right lower extremity returns for recheck  today.  The patient has done fairly well today.  Wound of the right foot  continues to show improvement.  He has another wound #5 of the right  second toe now measuring 0.2 x 0.2 x 0.1 cm.  Wounds were clean.   TREATMENT:  Sharp debridement was done of the wound of the right foot #3  and of the right second toe #5.  Both wounds appeared improved.  Slough  and callus were removed and these were selectively debrided of 20 cm2 or  less using scalpel.  No bleeding was encountered at either side.   Following sharp debridement, gauze and stretch wrap was used over the  right second toe.  The patient was advised to return in 2 weeks.   ICD-9 code 998.83, nonhealing surgical wound.   CPT code 44010 and [phone], selective debridement less than 20 cm2.      Lenon Curt Chilton Si, M.D.  Electronically Signed     AGG/MEDQ  D:  08/20/2008  T:  08/21/2008  Job:  [id number]

## 2010-09-05 NOTE — Consult Note (Signed)
NAME:  Antonio Wilson, Antonio Wilson NO.:  [id number]   MEDICAL RECORD NO.:  [id number]          PATIENT TYPE:  INP   LOCATION:  5023                         FACILITY:  [facility]   PHYSICIAN:  Rollene Rotunda, MD, FACCDATE OF BIRTH:  [birth year]   DATE OF CONSULTATION:  06/04/2008  DATE OF DISCHARGE:                                 CONSULTATION   PRIMARY CARDIOLOGIST:  Noralyn Pick. Eden Emms, MD, [facility]   PRIMARY CARE PHYSICIAN:  Burnell Blanks, MD   REQUESTING PHYSICIAN:  Harvie Junior, M.D.   REASON FOR CONSULTATION:  Preop evaluation for right great toe  amputation in the setting of osteomyelitis.   HISTORY OF PRESENT ILLNESS:  A 77 year old Caucasian male with known  history of CAD, coronary artery bypass grafting, ischemic  cardiomyopathy, and diabetes who is admitted 3 days ago, secondary to  osteomyelitis of the right great toe.  It has been determined that the  patient will have amputation of the right great toe and we are asked to  evaluate preoperatively.  The patient has not been seen by cardiology  since 2003.  The patient denies chest pain.  He has occasional shortness  of breath walking 100 yards.  He has no symptoms of weakness, dizziness,  nausea, or vomiting.  The patient had no recent cardiac complaints.  He  is able to climb stairs without difficulty with no chest pain or  shortness of breath.   REVIEW OF SYSTEMS:  Positive for mild shortness of breath.  All other  systems are reviewed and found to be negative.   PAST MEDICAL HISTORY:  1. Nonsustained ventricular tachycardia.  2. CAD.      a.     Status post myocardial infarction in 2000.      b.     Coronary artery bypass grafting in 2000 LIMA to LAD, SVG to       right coronary artery, SVG to OM-1 and OM-2, SVG to diagonal.      c.     PCI to the right coronary artery 2.2 x 13 mm pixel stent.  3. Most recent cardiac catheterization, February 2003 revealing left      main 80% discrete stenosis, LAD 100% occluded,  circumflex 100%      occluded, right coronary artery 90% discrete distal lesion, SVG to      the right coronary artery 100% occluded, SVG to OM-1 and OM-2      normal, SVG to diagonal normal, LIMA to LAD normal.  EF at that      time 35%.  4. Diabetes.  5. Ischemic cardiomyopathy.  6. Hyperlipidemia.  7. Osteomyelitis with diabetic foot ulcer on the left.   PAST SURGICAL HISTORY:  Coronary artery bypass grafting in 2000.   SOCIAL HISTORY:  He lives in [place], alone.  His wife is in a rest  home.  He is retired.  He does not smoke.  Does not drink alcohol.   FAMILY HISTORY:  Mother deceased from old age.  Father deceased from  cancer.  He has 7 siblings and their health is unknown to him.  CURRENT MEDICATIONS:  1. Aspirin 81 mg daily.  2. Coreg 3.125 mg b.i.d.  3. Cipro 400 mg IV q.12 h.  4. Clindamycin 600 mg IV q.8 h.  5. Glimepiride 4 mg daily.  6. Metformin 500 mg daily.  7. Ramipril 5 mg daily.  8. Zocor 40 mg nightly.   ALLERGIES:  No known drug allergies.   CURRENT LABORATORY DATA:  Hemoglobin 11.9, hematocrit 34.3, white blood  cell 7.2, platelets 173, sodium 134, potassium 4.1, chloride 104, CO2 of  25, BUN 11, creatinine 0.73, and glucose 123.  EKG revealing normal  sinus rhythm, ventricular rate of 81 beats per minute with  IVCD, with  LAD,  left axis deviation.  Current chest x-ray no active lung disease.   PHYSICAL EXAMINATION:  VITAL SIGNS:  Blood pressure 132/53, pulse 91,  respirations 18, temperature 98.6, and O2 sat 98% on room air.  HEENT:  Head is normocephalic and atraumatic.  EYES:  PERRLA.  Mucous membranes of mouth pink and moist.  Tongue is  midline.  NECK:  Supple.  There is a mild carotid bruits.  There is no JVD.  CARDIOVASCULAR:  Distant heart sounds.  Regular rate and rhythm with 1/6  systolic murmur.  Pulses are diminished in the lower extremities, 2+ on  the right, 1+ on the left.  LUNGS:  Clear to auscultation.  ABDOMEN:  Soft and  nontender.  2+ bowel sounds.  EXTREMITIES:  He has right toe infection.  It is wrapped and it is  odorous.  He has diminished pulse on the left at 1+.  NEURO:  Cranial nerves II through XII are grossly intact.   IMPRESSION:  1. Osteomyelitis of the right great toe.  2. Coronary artery disease status post coronary artery bypass grafting      in 2000.  3. Status post percutaneous coronary intervention to right coronary      artery in 2003.  4. Ischemic cardiomyopathy with an EF of 35%.  5. Diabetes.   PLAN:  This is a 77 year old Caucasian male with known history of CAD,  CABG, ischemic cardiomyopathy, and diabetes who is admitted with  osteomyelitis with a right great toe with amputation.  He is moderate to  high risk for cardiac event under general anesthesia, nerve block to the  ankle is considered with moderate sedation is possibility per anesthesia  and this is recommended.  The patient will be cleared from cardiac  standpoint to proceed with a  procedure.  We will follow, the patient will need to have strict I&O.  We will consider decreasing Coreg before discontinuing before discharge.  The patient will also have a followup appointment made prior to  discharge to have continued cardiac follow up with Dr. Eden Emms.      Bettey Mare. Lyman Bishop, NP      Rollene Rotunda, MD, [facility]  Electronically Signed    KML/MEDQ  D:  06/04/2008  T:  06/05/2008  Job:  [id number]

## 2010-09-05 NOTE — Assessment & Plan Note (Signed)
[facility]   NAME:  EMBER, GOTTWALD NO.:  [id number]   MEDICAL RECORD NO.:  [id number]      DATE OF BIRTH:  [birth year]   PHYSICIAN:  Joanne Gavel, M.D.              VISIT DATE:                                   OFFICE VISIT   HISTORY:  A 77 year old white male with a nonhealing surgical wound, had  a right first ray amputation of the right lower extremity.  Today, he  presents with another wound on the plantar surface of the right third  toe.   PHYSICAL EXAMINATION:  Pulse is 48.  He is afebrile.  Blood pressure  178/82.  Glucose is 117.   The right amputation site has almost healed.  A scab is picked off and  there is a small area of abundant granulation, which was easily removed  with a forcep.  There is a 0.5 x 0.3 new wound on the plantar surface of  the third toe, this was sharply debrided using a scalpel taking away a  partial thickness skin and marked amount of callus.  This wound cannot  be probed deeper than the subcutaneous tissue.   IMPRESSION:  New ulceration which is anesthetic on the right third toe,  which has just been debrided and a tiny area of almost healing of the  right amputation site.  We will use triple antibiotic ointment and dry  dressing, and I will see the patient in a week.      Joanne Gavel, M.D.  Electronically Signed     RA/MEDQ  D:  08/04/2008  T:  08/05/2008  Job:  [id number]

## 2010-09-05 NOTE — Op Note (Signed)
NAME:  Antonio Wilson, Antonio Wilson NO.:  [id number]   MEDICAL RECORD NO.:  [id number]          PATIENT TYPE:  INP   LOCATION:  5023                         FACILITY:  [facility]   PHYSICIAN:  Harvie Junior, M.D.   DATE OF BIRTH:  1929-12-26   DATE OF PROCEDURE:  DATE OF DISCHARGE:                               OPERATIVE REPORT   PREOPERATIVE DIAGNOSIS:  Severely infected great toe on right.   POSTOPERATIVE DIAGNOSIS:  Severely infected great toe on right with  severely grown out nails 2, 3, and 4.   PRINCIPAL PROCEDURE:  1. Great ray amputation, right.  2. Excision of sesamoids, right.  3. Debridement of toe nails at 2, 3, and 4.   SURGEON:  Harvie Junior, MD.   ASSISTANT:  Marshia Ly, PA   ANESTHESIA:  General.   BRIEF HISTORY:  [name] is a 77 year old male with long history of  having had significant infection of his right foot.  We initially  started treating him as an outpatient, told him he needed an amputation.  There was some delay to getting him there because of some issues  relative to have him not wanting to go so early and relative to some  confusion in communication.  He actually showed up at another time at  the [facility], not on a day we had originally had him scheduled  and there was some confusion there.  Anyway, he came back to the office  and had a worsening infection.  We admitted him to hospital for a couple  of days IV to get the erythema under control and his toes actually  looked much better and ultimately he was brought for the amputation of  the great ray.   PROCEDURE:  The patient was brought to the operating room.  After  adequate anesthesia obtained with general anesthetic, the patient was  placed supine on the table.  The right leg was prepped and draped in  usual sterile fashion.  Following this, an incision was made along the  great ray down ellipsing the great toe.  The great toe and half of the  great ray were amputated at  this point.  Following this, the sesamoids  were excised and reapproximated the tendons covering the sesamoids with  amputated.  At this point, the wound was copiously irrigated with  irrigation and closed loosely over a sponge.  The patient had  significant overgrowth of the nails at 2, 3, and 4, we took a bone  cutter and debrided these while we were here.  At this point, the  sterile compressive dressing was applied and was taken to the recovery  room and was noted to be in the satisfactory condition.  Estimated blood  loss throughout the procedure was less than 50 mL.     Harvie Junior, M.D.  Electronically Signed    JLG/MEDQ  D:  06/04/2008  T:  06/05/2008  Job:  [id number]

## 2010-09-05 NOTE — Assessment & Plan Note (Signed)
[facility]   NAME:  Antonio Wilson, Antonio Wilson                 ACCOUNT NO.:  [id number]   MEDICAL RECORD NO.:  [id number]      DATE OF BIRTH:  [birth year]   PHYSICIAN:  Jonelle Sports. Sevier, M.D.  VISIT DATE:  06/23/2008                                   OFFICE VISIT   HISTORY:  This 77 year old white male is recently status post ray  amputation of the hallux and first metatarsal head of the right foot  secondary to diabetic ulcer with osteo.  He has been since that surgery  on Augmentin and was seen here for the first time a week ago at which  time his wound was slightly open but certainly not fully dehisced.  It  looked to be doing satisfactorily and at that time main thing we did was  cleaned it up a bit and to place him on Neosporin application and proper  dressings.   He returns this week having seeing a surgeon again in the interim his  medication had been changed to doxycycline presumably based on specific  culture data.  He is taking that at 100 mg b.i.d..   Meanwhile, the remainder of the patient's sutures were removed and the  wound has not further dehisced and appears to be doing satisfactorily.  He has some degree of inflammation throughout the foot but this is more  that of relative ischemia and healing rather than having an apparent  infectious nature to it.   He arrives today with no particular complaints.   EXAMINATION:  Blood pressure 173/66, pulse 74, respirations 16,  temperature 97.5.  The wound itself now measures 5.4 x 1.0 x 0.3 cm and  has somewhat granular base but with considerable fibrinous exudate  there.   IMPRESSION:  Satisfactory course with eventual healing certainly  expected.   DISPOSITION:  The wound is selectively debrided of some of the material  from its base and I of course realize that this could be endless leading  into recesses and actually open the wound wider.  So I kept my  debridement relatively superficial but leaving the wound  base that is  relatively more healthy than what was started with.   My decision today is to treat this with an application of Iodosorb gel  which we will have him changed at home by his visiting nurse on every-  other-day basis with cleansing the wound each time.  He will be dressed  with a nonstick pad, a bulky wrap and a healing sandal.   Follow up visit will be here in 2 weeks.           ______________________________  Jonelle Sports Cheryll Cockayne, M.D.     RES/MEDQ  D:  06/23/2008  T:  06/24/2008  Job:  [id number]

## 2010-09-05 NOTE — Assessment & Plan Note (Signed)
[facility]   NAME:  Antonio Wilson, Antonio Wilson                 ACCOUNT NO.:  [id number]   MEDICAL RECORD NO.:  [id number]      DATE OF BIRTH:  21-Aug-1929   PHYSICIAN:  Jonelle Sports. Sevier, M.D.  VISIT DATE:  07/21/2008                                   OFFICE VISIT   HISTORY:  This 77 year old white male has been followed for a nonhealing  surgical wound at the site of a first ray amputation of the right lower  extremity.  He has shown progressive improvement under treatment here  and when last seen, was rapidly approach in healing.  He reports no  problems in the interim week.  He has been dressing his wound at home on  a daily basis, cleansing it gently, and using Neosporin and a dry  dressing.   PHYSICAL EXAMINATION:  Blood pressure 138/83, pulse 74, respirations 18,  and temperature 98.1.  The wound on the right foot at the halluceal  base, site of the previous surgery now measures 3.5 x 0.8 x 0.2 cm, has  essentially no significant remaining slough in the wound base, does have  one small hypergranular area on the proximal end of the wound and has a  considerable crust, particularly toward the distal end of the wound.   IMPRESSION:  Continued improvement, surgical wound, right foot.   DISPOSITION:  The area of hypergranulation is cauterized with silver  nitrate caustic with good tolerance.  The crust from the distal wound  are debrided away with the use of a scalpel and with no new problems  discovered.  The wound will again be treated with an application of  Neosporin and a dry dressing, and the patient is advised to cleanse the  wound and change the dressing on a daily basis.  Follow up visit will be  here in 2 weeks.           ______________________________  Jonelle Sports Cheryll Cockayne, M.D.     RES/MEDQ  D:  07/21/2008  T:  07/22/2008  Job:  [id number]

## 2010-09-05 NOTE — Assessment & Plan Note (Signed)
[facility]                            CARDIOLOGY OFFICE NOTE   Antonio Wilson, Antonio Wilson                        MRN:          [id number]  DATE:07/01/2008                            DOB:          1929-07-14    HISTORY OF PRESENT ILLNESS:  Antonio Wilson returns today for followup.  I have  not seen him in 4 years.  He has a history coronary artery bypass  surgery.  He also has had previous carotid bruit and mild aortic  stenosis.   He has been treated for hypertension, diabetes, and  hypercholesterolemia.  His primary care doctor, is Dr. Marjie Skiff.  Since I  last saw him he has had some complications from diabetes.  He has had  his right toe amputated under local by Dr. Jodi Geralds.  He still has  wrap on it and he has a walking boot   He tells me, he had a circulation checked and it was not a circulatory.   He continues to have depression.  His wife is in the nursing home.  He  was with his niece today.  He otherwise has lost about 30 pounds since I  last saw him.  Part of this is from his depression, but he seems to be  taking better care of himself lately.   REVIEW OF SYSTEMS:  Remarkable for no significant chest pain.  No PND or  orthopnea.  No lower extremity edema outside of the ulcer in the right  leg.  He has not had syncope.  He has been compliant with his  medications. He says, sugars have been in a good range.   REVIEW OF SYSTEMS:  Otherwise negative.   ALLERGIES:  No known allergies.   MEDICATIONS:  1. Altace 5 a day.  2. Coreg 3.125 b.i.d.  3. Lipitor 20 a day.  4. Aspirin a day.  5. Glimepiride 4 mg day.  6. Metformin 500 b.i.d.   PHYSICAL EXAMINATION:  GENERAL:  Remarkable for a talkative male, in no  distress.  Blood pressure 122/80, pulse 73 and regular, weight 163, back  in 2006 he was 184.  HEENT:  Unremarkable.  NECK:  Carotids have a faint right bruit.  No lymphadenopathy,  thyromegaly, or JVP elevation.  LUNGS:  Clear.  Good  diaphragmatic motion.  No wheezing.  S1 and S2 with  a moderate AS murmur.  PMI normal.  ABDOMEN:  Benign.  Bowel sounds positive.  No AAA, no tenderness, no  bruit, no hepatosplenomegaly, and no hepatojugular reflux.  EXTREMITIES:  Distal pulses intact on the left.  The right is wrapped in  a bandage with a walking boot status post right toe amputation.  SKIN:  Warm and dry otherwise no muscular weakness.   His EKG shows sinus rhythm with a occasional PAC.  Left axis deviation,  poor R wave progression.   IMPRESSION:  1. Coronary artery disease, previous coronary artery bypass graft.      Not having chest pain.  Continue aspirin therapy.  2. Aortic stenosis.  Looking back through his records, he has not had  an echocardiogram in over 4 years and it could be reasonable to      reassess his gradients.  There is also indication that he had      moderate decrease in left ventricular function and we will reassess      this.  3. Hypertension, currently well controlled.  Continue current dose of      ACE inhibitor.  4. Right carotid bruits may be a transmitted murmur.  Check carotid      duplex since he has known vascular disease status post coronary      artery bypass graft.  5. Hyperlipidemia.  Continue Lipitor.  Lipid and liver profile per      primary care MD.  6. Diabetes.  Follow up with primary care MD.  Hemoglobin A1c      quarterly.  7. Right toe amputation.  I suspect this was osteomyelitis due to      diabetic complication.  Follow up with Dr. Luiz Blare in the [facility].     Noralyn Pick. Eden Emms, MD, [facility]  Electronically Signed    PCN/MedQ  DD: 07/01/2008  DT: 07/02/2008  Job #: [id number]

## 2010-09-05 NOTE — Op Note (Signed)
NAME:  Antonio Wilson, Antonio Wilson NO.:  [id number]   MEDICAL RECORD NO.:  [id number]          PATIENT TYPE:  INP   LOCATION:  5031                         FACILITY:  [facility]   PHYSICIAN:  Harvie Junior, M.D.   DATE OF BIRTH:  1929-07-27   DATE OF PROCEDURE:  09/01/2008  DATE OF DISCHARGE:                               OPERATIVE REPORT   PREOPERATIVE DIAGNOSIS:  Infected third toe with osteomyelitis and  cellulitis to 2 through 4 toes, status post previous great ray  resection.   POSTOPERATIVE DIAGNOSIS:  Infected third toe with osteomyelitis and  cellulitis to 2 through 4 toes, status post previous great ray  resection.   PROCEDURE:  Transmetatarsal amputation.   SURGEON:  Harvie Junior, MD   ASSISTANT:  Marshia Ly, PA   ANESTHESIA:  General.   BRIEF HISTORY:  [name] is a 77 year old male with long history of  having had significant drainage from his foot even treated in the W[facility]  Center for long time.  He had a great ray resection and did well for  about 6 months and then began having increasing drainage over his third  toe.  He had cellulitis.  When presented to the office, we talked about  a transmetatarsal amputation.  This is what he was requesting.  We  talked about BKA and he did not want to go that far, and certainly, we  felt that he would heal at trans-MET.  He was taken to operating room  for transmetatarsal amputation.   PROCEDURE:  The patient was taken to operating room.  After adequate  anesthesia was obtained under general anesthetic, the patient was placed  supine on the operating table.  The right leg was prepped and draped in  the sterile fashion.  Leg was exsanguinated for 3 minutes and blood  pressure tourniquet was inflated to 350 mmHg.  Following this, a curved  incision was made over the mid foot and a longer flap posteriorly.  This  was taken down at the level of metatarsals.  The metatarsal was then  resected individually with a saw  and removed.  At this point, just the  flap posteriorly remained and it did look like it had very nice coverage  up over the bony resection.  Tourniquet was let down at this point and  all bleeders was controlled with electrocautery and then was closed with  interrupted Vicryl and skin staples.  Sterile compressive dressing was  applied, and the patient was taken to recovery room and was noted to be  in satisfactory condition.  Estimated blood loss for this procedure was  less than 50 mL.      Harvie Junior, M.D.  Electronically Signed     JLG/MEDQ  D:  09/01/2008  T:  09/02/2008  Job:  [id number]

## 2010-09-08 NOTE — Discharge Summary (Signed)
NAME:  Antonio Wilson, Antonio Wilson NO.:  [id number]   MEDICAL RECORD NO.:  [id number]          PATIENT TYPE:  INP   LOCATION:  5713                         FACILITY:  [facility]   PHYSICIAN:  Madaline Guthrie, M.D.    DATE OF BIRTH:  05-Apr-1930   DATE OF ADMISSION:  03/01/2005  DATE OF DISCHARGE:                                 DISCHARGE SUMMARY   1.  Left diabetic foot ulcer and cellulitis with left second toe      osteomyelitis.  2.  Uncontrolled diabetes mellitus type 2.  3.  Depression.  4.  __________ disease.   LIST OF MEDICATIONS:  1.  Augmentin 875 mg p.o. b.i.d. x6 weeks.  2.  Protonix 40 mg p.o. daily.  3.  Metformin 500 mg p.o. b.i.d.  4.  Fluoxetine 20 mg p.o. daily.  5.  Metoprolol 12.5 mg p.o. b.i.d.  6.  Darvocet one tablet by mouth q.6h. p.r.n. for __________.   The patient was stable at the time of discharge.  He will be followed up at  the wound care clinic at the [facility] for debridement, cleaning,  pulse lavage and other treatment as deemed necessary five times a week.  He  will be followed up by Dr. Shelle Iron of [facility] on March 20, 2005, at 2:45 p.m.  Next, he will be followed up by his primary physician,  Dr. Burnell Blanks, on March 16, 2005, at 10 a.m.  He may need a CVTS  evaluation if there is no healing of his left diabetic foot.   PROCEDURES AND IMPORTANT DIAGNOSTIC STUDIES:  MRI of the lower extremities  was done, which showed osteomyelitis involving the entire second toe of the  left foot.  There was also a second focus of edema enhancement of the  calcaneus compatible with osteomyelitis.  There was no __________.  X-ray of  the left foot showed osteomyelitis involving the remaining portion of the  second toe.  A Doppler study of the ABIs was within normal limits at rest.  Duplex imaging of the left legs revealed diffuse disease throughout with no  area of significant stenosis.   CONSULTATIONS:  Dr. Shelle Iron, [facility], a doctor at [facility].   BRIEF ADMITTING HISTORY AND PHYSICAL EXAMINATION:  Mr. __________ history of  coronary artery disease, cardiac catheterization and stenting, type 2  diabetes, not taking medications, presented with left leg swelling and  erythema.  He has had a history of left second toe amputation in 2000  secondary to trauma.  Two months ago he noticed erythema and pain, which has  gradually increased and has extended from the second toe upward toward the  ankle.  He has also noticed a little bit of oozing __________ in 2000,  diabetes mellitus type 2 not on any medications, benign prostatic  hypertrophy, left second toe amputation in 2000, hyperlipidemia.   His medications:  Saw palmetto six tablets a day for his prostate,  __________ and has Medicare.   FAMILY HISTORY:  Father died at age of 67 with cancer.  Mother died at age  of 52 with an unknown disease.   REVIEW OF SYSTEMS:  Positive for chills, weight loss, increased urinary  frequency and occasional joint pain.   PHYSICAL EXAMINATION:  VITAL SIGNS:  __________ 67, pulse 93, respiratory  rate 20, O2 saturation is 99%.  GENERAL:  Alert, awake, normal build.  HEENT:  Eyes:  Pupils were equal and reactive.  External ocular movements  intact.  ENT:  Oropharynx normal.  NECK:  JVD negative. Lymphadenopathy __________.  LUNGS:  Clear to auscultation bilaterally.  CARDIOVASCULAR:  S1, S2 present.  No murmur, rub, or gallop.  GASTROINTESTINAL:  Bowel sounds present, nontender, nondistended.  EXTREMITIES:  Left leg pulse was feeble.  Erythema extending from the toe to  the area above the ankle.  Partially amputated second toe.  Ulceration in  the inferior aspect of the second toe.  Normal calf __________.  NEUROLOGIC:  Cranial nerves II-XII intact.  Motor and sensory system intact.  Cerebellar system intact.  Oriented x3.   __________ sodium 136, potassium 4.6, chloride 103, bicarb 26, BUN 15,  creatinine  1.  Platelets of 217.  Hemoglobin A1c was 12.6.  CRP was 12.6.   HOSPITAL COURSE BY PROBLEM:  Problem 1.  LEFT DIABETIC FOOT ULCER WITH CELLULITIS AND LEFT SECOND TOE  OSTEOMYELITIS:  At the time of presentation he had an ulcer in the plantar  aspect of his foot and cellulitis extending from the toes to the ankle area.  MRI was ordered, which showed second toe osteomyelitis and cellulitis and  myositis.  He was put on empiric vancomycin and Zosyn.  Two blood cultures  were sent before starting the antibiotics, and Dr. Shelle Iron from orthopedics  was consulted.  I&D and bone curettage was done by Dr. Shelle Iron.  The ulcer was  debrided and he received pulse therapy and daily wound cleaning and  debridement during the hospitalization.  ID consult was obtained regarding  the type and duration of antibiotics.  Dr. Roxan Hockey saw the patient.  Both  of his blood cultures were negative, so Dr. Roxan Hockey recommended p.o.  Augmentin 875 mg b.i.d. for six weeks.  The patient improved during the  hospital course.  The cellulitis improved and there were blisters that  developed on the plantar aspect of his left foot, which was debrided and  cleaned on a daily basis.  The patient will need follow-up at the wound care  clinic at [facility] at least five times a week, where debridement,  cleaning and pulse lavage will be required.  He was also sent home on a  rocker bottom boot and crutches to help him support the left foot.  He will  meet Dr. Shelle Iron in two weeks' time, where he will need to be reassessed for  improvement and plans for further management made.  He may need a CVTS  consult if he does not show improvement.   Problem 2.  UNCONTROLLED DIABETES:  At the time of admission his hemoglobin  A1c was 12.6.  He had not been taking any medications at home.  He was put  on glipizide and on the sliding scale insulin.  Throughout the hospitalization his blood sugars improved.  At the time of discharge  his  CBGs were running between 140 to 248 and he still needed a moderate amount  of NovoLog, so at the time of discharge metformin 500 mg b.i.d. was added.  His CBGs and hemoglobin A1c will need to be monitored by his primary  physician to assess  for improvement and adequate changes made as required.   Problem 3.  DEPRESSION:  During the hospitalization the patient admitted to  a low mood, insomnia and anhedonia.  He did not show any suicidal  tendencies.  He was put on Prozac 20 mg p.o. daily.  He will need to be  monitored for improvement by his primary physician.   Problem 4.  CORONARY ARTERY DISEASE:  The patient had a history of CABG and  stenting in the past.  He was put on aspirin during the hospitalization.  In  view of the slightly elevated blood pressure during the hospital stay, he  was put on a small dose of beta blocker 12.5 mg p.o. b.i.d.  His blood  pressure and his cardiovascular status needs to be monitored.   Problem 5.  PERIPHERAL VASCULAR DISEASE:  At the time of admission he had a  very feeble left dorsalis pedis pulse.  A lower extremity arterial  evaluation showed bilateral ABIs to be within normal limits.  Duplex imaging  of the left leg showed diffuse disease throughout but no area of significant  stenosis.   DISCHARGE:  At the time of discharge, the patient was stable.  The  cellulitis was improving.  He was getting daily dressings of his wound.  His  blood pressure was 124/64, pulse 77, temperature 98.3.  His labs:  Hemoglobin 13.2, hematocrit 38.7, WBC 5.7 and platelets 221.  Sodium 137,  potassium 4.1, chloride 103, bicarb 27, BUN 8, creatinine 0.8 and a glucose  of 123.  The patient will be sent home with home health nurse to help with  wound care, to help in diabetes education.  He will be followed up at the  wound care clinic for daily wound cleaning, and he also has a follow-up  appointment with his primary physician, Dr. Nathanial Rancher, and his orthopedic   doctor, Dr. Shelle Iron.      Ronda Fairly, M.D.      Madaline Guthrie, M.D.  Electronically Signed    YB/MEDQ  D:  03/07/2005  T:  03/08/2005  Job:  [id number]   cc:   [facility] Clinic   Burnell Blanks, M.D.   Jene Every, M.D.  Fax: [phone]

## 2010-09-08 NOTE — Cardiovascular Report (Signed)
[place]  Patient:    Antonio Wilson, Antonio Wilson Visit Number: [id number] MRN: [id number]          Service Type: CAT Location: 6500 [id number] Attending Physician:  Colon Branch Dictated by:   Noralyn Pick Eden Emms, M.D., [facility] Proc. Date: 06/06/01 Admit Date:  06/06/2001                          Cardiac Catheterization  PROCEDURE:  Coronary Angiography.  CARDIOLOGIST:  Noralyn Pick. Eden Emms, M.D., [facility]  INDICATION:  Decreasing ejection fraction, abnormal Cardiolite study with inferior wall infarct and ischemia.  RESULTS:  The left main coronary artery had an 80% discrete stenosis.  Left anterior descending artery was 100% occluded.  The circumflex coronary artery was 100% occluded.  Right coronary artery had 30 to 40% multiple discrete lesions in the proximal and mid portion.  There was a 90% discrete distal lesion.  There was diffuse posterolateral artery and PDA disease.  Saphenous vein graft to the right coronary artery was 100% occluded.  Saphenous vein graft to OM-1 and OM-2 was normal.  Saphenous vein graft to the diagonal was normal.  Left internal mammary artery to the LAD was normal.  RAO VENTRICULOGRAPHY:  RAO ventriculography revealed inferior wall as well as anterior and anteroapical wall hypokinesis, ejection fraction in the 30% range.  There was no gradient across the aortic valve and no MR.  Right heart catheterization was done due to decreased ejection fraction and shortness of breath.  Left ventricular pressure was 121/11, aortic pressure 120/70, mean RA pressure 7, PA pressure 22/11, mean pulmonary capillary wedge pressure 12.  Cardiac output was 3.4 liters/minute by Fick.  IMPRESSION:  Films will be reviewed with Dr. Riley Kill.  I suspect we will do angioplasty and stenting of the native right coronary artery to try to improve his ejection fraction and decrease his risk of myocardial infarction.  I also suspect that  this may have something to do with his increased frequency of nonsustained ventricular tachycardia, and he will continue to have to be restratified per medical criteria for defibrillator. Dictated by:   Noralyn Pick Eden Emms, M.D., [facility] Attending Physician:  Colon Branch DD:  06/06/01 TD:  06/06/01 Job: [id number]

## 2010-09-08 NOTE — Cardiovascular Report (Signed)
[place]  Patient:    MOO, GRAVLEY Visit Number: [id number] MRN: [id number]          Service Type: CAT Location: 6500 [id number] Attending Physician:  Colon Branch Dictated by:   Arturo Morton Riley Kill, M.D. [facility] Proc. Date: 06/06/01 Admit Date:  06/06/2001                          Cardiac Catheterization  INDICATIONS:  Mr. Kossman is a 77 year old with type 2 diabetes who presents with recurrent chest pain.  The saphenous vein graft to the distal right coronary is occluded, and there is a high-grade stenosis in the distal right coronary.  He was studied by Dr. Eden Emms.  The patient has a history of heart failure and is on Coreg as well as ACE inhibitors.  With the high-grade stenosis, Dr. Eden Emms felt that percutaneous coronary intervention would be the optimal approach since the patient has prior bypass.  Risks were discussed with the patient in the laboratory and decision made to proceed.  PROCEDURE:  Percutaneous stenting of the distal right coronary artery.  CARDIOLOGIST:  Arturo Morton. Riley Kill, M.D. [facility]  DESCRIPTION OF THE PROCEDURE:  The patient had diagnostic catheterization by Dr. Eden Emms.  There was an indwelling femoral vein sheath as well as an indwelling 6-French arterial sheath.  Heparin and Integrilin were given according to protocol.  ACT was checked and found to be just over 300 seconds. The 6-French sheath was exchanged for a 7-French sheath.  A JR4 guiding catheter with side holes, 7-French, was utilized to cannulate the right coronary artery.  A 0.014 high-torque floppy wire was taken down distally.  We initially attempted to cross with a 2.5 Quantum Maverick balloon, but this would not cross the lesion, so a 2.0 CrossSail was required to get across the lesion.  This was predilated and the vessel stented with a 2.25 x 13 Pixel stent.  The Pixel stent was then post dilated using a 2.75 x 8 Quantum Maverick post dilatation balloon.   There was marked improvement in the appearance of the artery with reduction in the stenosis from 90 to about 10%. This 10% remained despite 16 atmosphere inflation within the middle of the stent.  Given the small size of the vessel, we elected not to press harder. There was an excellent angiographic appearance with TIMI-3 flow at the completion of the procedure, and all catheters were subsequently removed.  HEMODYNAMIC DATA:  See diagnostic catheterization report by Dr. Eden Emms.  ANGIOGRAPHIC DATA:  The right coronary artery has a slightly upward takeoff with some smooth irregularity in the proximal portion of the vessel.  The vessel has minor luminal irregularities throughout and then a high-grade stenosis of 90% which is eccentric just prior to the takeoff of the PDA and posterolateral system.  Following percutaneous intervention, this 90% stenosis was reduced to about 10% residual luminal narrowing.  Final angiographic result was excellent.  CONCLUSION:  Successful percutaneous stenting of the distal right coronary.  DISPOSITION:  The patient will need to be treated with aspirin and Plavix. Because of his diabetes and small vessel size, the risk of target vessel revascularization is increased. Dictated by:   Arturo Morton Riley Kill, M.D. [facility] Attending Physician:  Colon Branch DD:  06/06/01 TD:  06/06/01 Job: [id number]

## 2010-09-08 NOTE — Discharge Summary (Signed)
NAME:  Antonio Wilson, Antonio Wilson NO.:  [id number]   MEDICAL RECORD NO.:  [id number]          PATIENT TYPE:  INP   LOCATION:  7829                         FACILITY:  [facility]   PHYSICIAN:  Harvie Junior, M.D.   DATE OF BIRTH:  08-May-1929   DATE OF ADMISSION:  09/01/2008  DATE OF DISCHARGE:  09/04/2008                               DISCHARGE SUMMARY   ADMITTING DIAGNOSES:  1. Infection with osteomyelitis, right foot with draining third toe.  2. Diabetes mellitus.  3. Hypertension.  4. History of myocardial infarction.  5. Hyperlipidemia.   PROCEDURES IN-HOSPITAL:  Right foot transmetatarsal amputation by Jodi Geralds, MD, on Sep 01, 2008.   BRIEF HISTORY:  Antonio Wilson is a 77 year old diabetic who was well known  to [place].  He has a history of a right great toe amputation in February  2010.  He did well initially but presented to our office with a several-  week history of pain and swelling with redness.  X-rays showed probable  osteomyelitis of the right third metatarsal head and the third toe was  open and draining despite wound care and oral antibiotics.  Based upon  his clinical and x-ray findings which showed osteomyelitis, he was felt  to be a candidate for a right foot transmetatarsal amputation, and after  a lengthy discussion with the patient about this, he agreed and wished  to proceed with this.   PERTINENT LABORATORY STUDIES:  The patient's hemoglobin on admission was  12.2, hematocrit 36.0, WBC was 6.2.  The patient's BMET on admission  showed a glucose of 101, otherwise, was within normal limits.  CBGs  during the hospitalization ranged from 89 up to 157 and mostly ranged  down around 100-110.   HOSPITAL COURSE:  The patient was brought to the operating room.  Preoperatively, he was given a gram of Ancef prior to being brought to  the OR per protocol.  He was taken to the OR where he underwent right  foot transmetatarsal amputation without complications.  This  is well  described in Dr. Luiz Blare' operative report.  A Penrose drain was placed  to the right foot.  He was continued on IV antibiotics during this  hospitalization.  On postop day #1, he was without complaints.  He was  taking fluids and voiding without difficulty, gotten out of bed to the  chair.  His CBGs were running from 101-121.  He was afebrile.  His right  foot dressing was clean and dry.  There was no drainage.  The patient  was alert and oriented x3.  He was continued with the IV antibiotics and  had a physical therapy evaluation.  On postop day #2, he was taking  fluids without difficulty.  He was voiding.  His right foot wound was  benign.  His skin edges looked good and pain.  The Penrose drain was  intact.  He was continued on IV Ancef for an additional day.  The  dressing was changed, and the Penrose drain was pulled.  On Sep 04, 2008, postop day #3, the  patient was without complaints.  He was  afebrile, and his vital signs were stable.  His right foot dressing was  clean and dry.  He had no streaking or redness in his foot or leg.  His  IV was discontinued.  He was discharged home in improved condition.  He  is on a regular diet.  He was given a Rx for Vicodin 5 mg p.r.n. pain  and take as directed and Cipro 500 mg b.i.d. x2 weeks.  He will continue  his usual medications which were  documented on the med rec discharge order sheet.  He is instructed to  change his dressing every 3 days at home and follow up with Dr. Luiz Blare  in the office in 2 weeks.  He will be partial weightbearing putting most  of his weight on his right heel with a postoperative shoe.  He knows to  call should he have any questions or difficulties.      Marshia Ly, P.A.      Harvie Junior, M.D.  Electronically Signed    JB/MEDQ  D:  11/22/2008  T:  11/23/2008  Job:  [id number]   cc:   Noralyn Pick. Eden Emms, MD, [facility]

## 2010-09-08 NOTE — Discharge Summary (Signed)
[place]  Patient:    Antonio Wilson, Antonio Wilson Visit Number: [id number] MRN: [id number]          Service Type: CAT Location: [id number] Attending Physician:  Colon Branch Dictated by:   Joellyn Rued, P.A.-C. Admit Date:  06/06/2001 Disc. Date: 06/07/01   CC:         Maricela Bo, M.D. in [place], [place]   Referring Physician Discharge [place]  DATE OF BIRTH:  08/04/1929  SUMMARY OF HISTORY:  Antonio Wilson is a 77 year old white male.  He was seen in the office on May 30, 2001 by Dr. Eden Emms on follow up on his Cardiolite performed on May 08, 2001.  During the Cardiolite he had significant runs of nonsustained ventricular tachycardia and decreased ejection fraction of 30% as compared to a MUGA scan in September 2000 which showed an EF of 43%.  The imaging showed inferior wall infarct or thinning and Dr. Ricki Miller impression was there was mild inferior wall ischemia.  He felt that with his nonsustained ventricular tachycardia, decreased ejection fraction, and known history of coronary artery disease, heart catheterization was in order, thus his admission.  His history is notable for bypass surgery in May 2000 by Dr. Donata Clay with a LIMA to the LAD, sequential saphenous vein graft to the second and third OM branches, a saphenous vein graft to the diagonal, and a saphenous vein graft to the PDA.  His history is also notable for hyperlipidemia, non-insulin-dependent diabetes.  LABORATORY DATA:  Preadmission H&H was 16.5 and 49.9, normal indices, platelets 149, wbcs 5.4.  PT 11.3, PTT 24.8.  Sodium 134, potassium 5.1, BUN 12, creatinine 0.6, glucose 216.  EKG showed normal sinus rhythm with occasional PVC, possible old septal MI. Subsequent EKGs showed sinus bradycardia, left axis deviation, possible old septal MI.  Post catheterization H&H 15.3 and 44.5, platelets 119, wbcs 7.2.  Sodium 141, potassium 4.0, BUN 9, creatinine 0.9, glucose  136.  HOSPITAL COURSE:  Mr. Harvel underwent outpatient cardiac catheterization.  He had native three-vessel coronary artery disease.  His saphenous vein graft to the PDA was 100% occluded.  The saphenous vein graft to the OM-1 and OM-2 was patent, as well as the saphenous vein graft to the diagonal one and the LIMA to the LAD.  His ejection fraction was 30% with anterior hypokinesis. Reviewed with Dr. Riley Kill and it was felt that he should undergo intervention on his saphenous vein graft to the RCA.  Angioplasty stenting was performed to the saphenous vein graft to the RCA, reducing the lesion from 90% to 10%, restoring TIMI 3 flow.  Dr. Riley Kill felt that he needed Plavix for at least four weeks.  Post sheath removal and bedrest, patient was ambulating without difficulty.  On the morning of February 15 after reviewing, Dr. Eden Emms felt that he could be discharged home.  DISCHARGE DIAGNOSES: 1. Abnormal Cardiolite with associated nonsustained ventricular tachycardia    and further decrease of his left ventricular function. 2. Progressive coronary artery disease with angioplasty stenting in the    saphenous vein graft to the right coronary artery. 3. History as previously.  DISPOSITION:  He is discharged home.  MEDICATIONS: 1. Plavix 75 mg q.d. for one month. 2. Coreg 3.125 mg b.i.d. 3. Glucotrol XL 5 mg q.d. 4. Lipitor 20 mg q.h.s. 5. Altace 2.5 mg q.d. 6. Coated aspirin 325 mg q.d. 7. Sublingual nitroglycerin p.r.n.  ACTIVITY:  He was advised no lifting, driving, sexual activity, or  heavy exertion for three days.  DIET:  Maintain low salt/fat ADA diet.  WOUND CARE:  If he had any problems with his catheterization site he was asked to call our office.  FOLLOW-UP:  He will see Dr. Eden Emms on June 16, 2001 at 10:30 a.m. in the [place] office. Dictated by:   Joellyn Rued, P.A.-C. Attending Physician:  Colon Branch DD:  06/07/01 TD:  06/07/01 Job:  [id number]

## 2010-09-08 NOTE — Consult Note (Signed)
NAME:  Antonio Wilson, Antonio Wilson                 ACCOUNT NO.:  [id number]   MEDICAL RECORD NO.:  [id number]            PATIENT TYPE:   LOCATION:                                 FACILITY:   PHYSICIAN:  Jonelle Sports. Sevier, M.D.      DATE OF BIRTH:   DATE OF CONSULTATION:  03/14/2005  DATE OF DISCHARGE:                                   CONSULTATION   HISTORY:  This 77 year old white male is referred at the courtesy of Dr.  Wyonia Hough for ongoing management of osteomyelitis and diabetic ulcer of the  left foot.   The patient apparently has had type 2 diabetes for a number of years and  also significant cardiovascular disease to include peripheral vascular  disease. He has not had previous difficulties of any consequence with his  feet until 2002 when he had trauma to the left second toe resulting in a  partial amputation. Three months prior to this admission, he noticed  erythema and pain extending from that toe up toward the ankle and also  oozing from an area beneath the first metatarsal head on that same left  foot. This led to his eventual consultation and hospitalization from  November 9 through 15. During that hospitalization, he had an MRI which  showed evidence for osteomyelitis there at the base of the second toe and  also question of some osteomyelitis in the os calcis on that side. He was  seen by Dr. Jene Every and had the debridement of the major ulcer  underlying his first metatarsal head, and it was noted that this ulcer  penetrated rather deeply and dissected somewhat toward the mid foot. At that  same time, bone was curetted from the base of the second toe in hopes of  resolving the osteomyelitic issue there. Subsequent foot x-ray, however, on  November 12 suggested there was persistence of osteomyelitis of the base the  second toe. It was felt that there probably was not osteomyelitis in the os  calcis and did not seem to suggest deep abscess or osteomyelitis in the area  of the  primary plantar ulcer. During hospitalization, he had blood cultures  done which were negative, and with a determination of no cellulitis or  osteomyelitis, was seen by Dr. Pauline Good who recommended p.o.  Augmentin 875 mg twice daily for 6 weeks. That was begun during the hospital  and continued following his discharge, and, in fact, he continues on that to  the moment. Also during hospitalization, lower extremity ABIs were  considered to be within normal limits, but duplex imaging of the left leg  showed diffuse arterial disease but no area of focal stenosis.   With that background, the patient is referred here now for ongoing wound  management.   PAST MEDICAL HISTORY:  Notable for coronary artery bypass grafting in 2000  and coronary stent placement in 2005. He also as had some difficulty with  urinary tract symptoms, likely on a prostate basis, and occasional urinary  tract infection. It bears mention that his diabetes has been chronically  uncontrolled and  that he was off medication at the time he entered the  hospital. He was begun on sliding scale insulin and glipizide at  hospitalization.   REGULAR MEDICATIONS:  1.  Augmentin 750 mg p.o. twice daily as previously indicated.  2.  Protonix 40 mg daily.  3.  Metformin 500 mg twice daily.  4.  Paroxetine 20 mg daily.  5.  Metoprolol 12.5 mg p.o. twice daily.  6.  Darvocet-N 100 as needed for pain.   ALLERGIES:  He denies medicinal allergies.   PHYSICAL EXAMINATION:  Examination examination today is limited to the  distal lower extremities. The right lower extremity is characterized by very  dry skin, diminished pulses, absence of protective sensation, and an  extremely hard callus under the first metatarsal head area.   Left lower extremity is characterized by mild edema to the knee, again  diminished pulses, absence of protective sensation, and evidence for  previous partial amputation of the left second toe. On the  plantar aspect of  the second toe is an open wound where the apparent previous bone curettage  was done that now measures 0.9 x 0.4 x 0.2 cm and has moderate seropurulent  draining.   On the dorsal lateral aspect of the fifth toe on that left foot is a  hemorrhagic callus without open ulceration.   Underlying the first metatarsal head on the plantar aspect of the foot is a  large ulcer measuring 2.5 cm in length, 2.0 cm in width, and 0.6 cm in depth  with undermining from 12 o'clock to 3 o'clock down toward the mid foot.  There is some question of exposed muscle and bone in this wound, although it  is rather shaggy and a little difficult to tell.   DISPOSITION:  1.  The hemorrhagic callus on the dorsal aspect of the right fifth toe is      pared without incident.  2.  The wound at the base the toe is full-thickness debrided, and also there      is removal of the extensive dry separated skin from around the base of      all the toes and the forefoot area.  3.  The large plantar ulcer underlying the left first metatarsal head is      full-thickness debrided to include debridement of some subcutaneous      tissue which is soupy necrotic in nature. There is good bleeding with      debridement of the margins of the wound. Despite these efforts the wound      remains covered with approximately 6% of slough.  4.  The wound of the second toe is treated with an application of Bactroban,      and lamb's wool spacers are placed between all the toes.  5.  The plantar ulcer is treated with an application of Bactroban and the      packing with alginate dressing, then covered by an Allevyn pad.  6.  The entire forefoot to encompass both these wound was then further      dressed with a bulky wrap over which Coban is placed lightly to help      hold the wrap in place and to protect it from the elements.  7.  The patient is to continue with Darco platform walker. He is also to     continue his  Augmentin 875 mg twice daily.  8.  It is discussed with the patient, because the persistence of bone  infection and depth of the wound on the plantar aspect of his feet, that      he should likely been treated with hyperbaric oxygen therapy if we can      ascertain that from a cardiac point that this could be tolerated. He is      in agreement. Accordingly, we have requested records from [facility] to include most recent left ventricular ejection fraction      determination as well as their clinical thoughts as to his      appropriateness for hyperbaric oxygen therapy. We will also request a      copy of the chest x-ray made when he was in the hospital for this      purpose.   The the patient is advised to return here for followup in 1 week. He is to  call immediately to his primary doctor or to the emergency room sooner  should he have any interim problems.           ______________________________  Jonelle Sports Cheryll Cockayne, M.D.     RES/MEDQ  D:  03/14/2005  T:  03/14/2005  Job:  [id number]   cc:   Donny Pique, M.D.  [facility]  [place], [place]   Madaline Guthrie, M.D.  Fax: [phone]

## 2010-09-19 ENCOUNTER — Encounter: Payer: Self-pay | Admitting: Cardiovascular Disease

## 2010-09-26 ENCOUNTER — Ambulatory Visit (INDEPENDENT_AMBULATORY_CARE_PROVIDER_SITE_OTHER): Payer: Medicare Other | Admitting: Cardiovascular Disease

## 2010-09-26 ENCOUNTER — Encounter: Payer: Self-pay | Admitting: Cardiovascular Disease

## 2010-09-26 DIAGNOSIS — I350 Nonrheumatic aortic (valve) stenosis: Secondary | ICD-10-CM

## 2010-09-26 DIAGNOSIS — I10 Essential (primary) hypertension: Secondary | ICD-10-CM

## 2010-09-26 DIAGNOSIS — R0989 Other specified symptoms and signs involving the circulatory and respiratory systems: Secondary | ICD-10-CM

## 2010-09-26 DIAGNOSIS — E7800 Pure hypercholesterolemia, unspecified: Secondary | ICD-10-CM

## 2010-09-26 DIAGNOSIS — I359 Nonrheumatic aortic valve disorder, unspecified: Secondary | ICD-10-CM

## 2010-09-26 DIAGNOSIS — Z951 Presence of aortocoronary bypass graft: Secondary | ICD-10-CM

## 2010-09-26 NOTE — Assessment & Plan Note (Signed)
Well controlled.  Continue current medications and low sodium Dash type diet.    

## 2010-09-26 NOTE — Assessment & Plan Note (Signed)
ASA no TIA symptoms  F/U duplex in ayear

## 2010-09-26 NOTE — Progress Notes (Signed)
Antonio Wilson is seen today for F/U of CAD with prev. CABG in 2000. Known occlusion of RCA graft with stenting of the native distal RCA in 2010. Also history of mild AS and left carotid bruit. CRF's include elevated lipids and HTN. No cardiac complaints. Recent bilateral toe surgery by Dr Antonio Wilson. Denies SSCP, palpitations, dyspnea or syncope. Compliant with meds. Needs f/U carotid duplex in 6 months. His EF has been hard to gage by echo but is likely 30-35%. He has been euvolemic with no CHF He is very depressed as his invalid wife has recently passed and frankly I don't think Antonio Wilson knows what to do with himself  Still seems very depressed.  Sees Antonio Wilson for blook work and routine physical  ROS: Denies fever, malais, weight loss, blurry vision, decreased visual acuity, cough, sputum, SOB, hemoptysis, pleuritic pain, palpitaitons, heartburn, abdominal pain, melena, lower extremity edema, claudication, or rash.  All other systems reviewed and negative  General: Affect appropriate Healthy:  appears stated age HEENT: normal Neck supple with no adenopathy JVP normal bilateral  bruits no thyromegaly Lungs clear with no wheezing and good diaphragmatic motion Heart:  S1/S2 mild AS  murmur,rub, gallop or click PMI normal Abdomen: benighn, BS positve, no tenderness, no AAA no bruit.  No HSM or HJR Distal pulses intact with no bruits No edema Neuro non-focal Skin warm and dry No muscular weakness   Current Outpatient Prescriptions  Medication Sig Dispense Refill  . aspirin 81 MG tablet Take 81 mg by mouth daily.        Marland Kitchen atorvastatin (LIPITOR) 20 MG tablet Take 20 mg by mouth daily.        . beta carotene 16109 UNIT capsule Take 25,000 Units by mouth daily.        . carvedilol (COREG) 3.125 MG tablet Take 3.125 mg by mouth 2 (two) times daily with a meal.        . Flaxseed, Linseed, (FLAXSEED OIL PO) Take 2,400 mg by mouth daily.        . Garlic Oil 1000 MG CAPS Take 1 capsule by mouth daily.        Marland Kitchen  glimepiride (AMARYL) 4 MG tablet Take 4 mg by mouth daily before breakfast.        . metFORMIN (GLUMETZA) 500 MG (MOD) 24 hr tablet Take 500 mg by mouth 2 (two) times daily with a meal.        . Omega-3 Fatty Acids (FISH OIL) 500 MG CAPS Take 1 capsule by mouth 2 (two) times daily.        . ramipril (ALTACE) 5 MG capsule Take 5 mg by mouth daily.        . Saw Palmetto 450 MG CAPS Take 1 capsule by mouth daily.        Marland Kitchen DISCONTD: atorvastatin (LIPITOR) 40 MG tablet Take 40 mg by mouth daily.        Marland Kitchen DISCONTD: valACYclovir (VALTREX) 500 MG tablet Take 500 mg by mouth daily.          Allergies  Review of patient's allergies indicates no known allergies.  Electrocardiogram:  NSR 69 LBBB LAD no change from previous  Assessment and Plan

## 2010-09-26 NOTE — Patient Instructions (Addendum)
Your physician recommends that you schedule a follow-up appointment in: YEAR WITH DR [facility]  Your physician recommends that you continue on your current medications as directed. Please refer to the Current Medication list given to you today.  Your physician has requested that you have an echocardiogram. Echocardiography is a painless test that uses sound waves to create images of your heart. It provides your doctor with information about the size and shape of your heart and how well your heart's chambers and valves are working. This procedure takes approximately one hour. There are no restrictions for this procedure.  1 YEAR  DX AS  Your physician has requested that you have a carotid duplex. This test is an ultrasound of the carotid arteries in your neck. It looks at blood flow through these arteries that supply the brain with blood. Allow one hour for this exam. There are no restrictions or special instructions. YEAR DX BRUIT

## 2010-09-26 NOTE — Assessment & Plan Note (Signed)
Cholesterol is at goal.  Continue current dose of statin and diet Rx.  No myalgias or side effects.  F/U  LFT's in 6 months. No results found for this basename: LDLCALC             

## 2010-09-26 NOTE — Assessment & Plan Note (Signed)
Stable with no angina and good activity level.  Continue medical Rx  

## 2010-10-23 ENCOUNTER — Encounter: Payer: Self-pay | Admitting: Cardiovascular Disease

## 2011-07-26 ENCOUNTER — Telehealth: Payer: Self-pay | Admitting: Cardiovascular Disease

## 2011-07-26 NOTE — Telephone Encounter (Signed)
Spoke with pt, he wants to be seen prior to having any testing done. appt scheduled for June.

## 2011-07-26 NOTE — Telephone Encounter (Signed)
New msg Pt wants to discuss his upcoming appts. Please call him back

## 2011-09-18 ENCOUNTER — Ambulatory Visit (HOSPITAL_COMMUNITY): Payer: Medicare Other | Attending: Cardiology

## 2011-09-18 DIAGNOSIS — I359 Nonrheumatic aortic valve disorder, unspecified: Secondary | ICD-10-CM | POA: Insufficient documentation

## 2011-09-18 DIAGNOSIS — E785 Hyperlipidemia, unspecified: Secondary | ICD-10-CM | POA: Insufficient documentation

## 2011-09-18 DIAGNOSIS — I10 Essential (primary) hypertension: Secondary | ICD-10-CM | POA: Insufficient documentation

## 2011-09-18 DIAGNOSIS — I519 Heart disease, unspecified: Secondary | ICD-10-CM | POA: Insufficient documentation

## 2011-09-18 DIAGNOSIS — I2510 Atherosclerotic heart disease of native coronary artery without angina pectoris: Secondary | ICD-10-CM | POA: Insufficient documentation

## 2011-09-18 DIAGNOSIS — I350 Nonrheumatic aortic (valve) stenosis: Secondary | ICD-10-CM

## 2011-09-24 ENCOUNTER — Encounter (INDEPENDENT_AMBULATORY_CARE_PROVIDER_SITE_OTHER): Payer: Medicare Other

## 2011-09-24 DIAGNOSIS — I6529 Occlusion and stenosis of unspecified carotid artery: Secondary | ICD-10-CM

## 2011-09-24 DIAGNOSIS — R0989 Other specified symptoms and signs involving the circulatory and respiratory systems: Secondary | ICD-10-CM

## 2011-10-01 ENCOUNTER — Encounter: Payer: Self-pay | Admitting: Cardiovascular Disease

## 2011-10-01 ENCOUNTER — Ambulatory Visit (INDEPENDENT_AMBULATORY_CARE_PROVIDER_SITE_OTHER): Payer: Medicare Other | Admitting: Cardiovascular Disease

## 2011-10-01 VITALS — BP 150/70 | HR 82 | Ht 71.0 in | Wt 180.0 lb

## 2011-10-01 DIAGNOSIS — R0989 Other specified symptoms and signs involving the circulatory and respiratory systems: Secondary | ICD-10-CM

## 2011-10-01 DIAGNOSIS — I2589 Other forms of chronic ischemic heart disease: Secondary | ICD-10-CM

## 2011-10-01 DIAGNOSIS — I2510 Atherosclerotic heart disease of native coronary artery without angina pectoris: Secondary | ICD-10-CM

## 2011-10-01 DIAGNOSIS — Z951 Presence of aortocoronary bypass graft: Secondary | ICD-10-CM

## 2011-10-01 DIAGNOSIS — I359 Nonrheumatic aortic valve disorder, unspecified: Secondary | ICD-10-CM

## 2011-10-01 NOTE — Progress Notes (Signed)
Patient ID: Antonio Wilson, male   DOB: [birth year], 78 y.o.   MRN: [id number] Antonio Wilson is seen today for F/U of CAD with prev. CABG in 2000. Known occlusion of RCA graft with stenting of the native distal RCA in 2010. Also history of mild AS and left carotid bruit. CRF's include elevated lipids and HTN. No cardiac complaints. Recent bilateral toe surgery by Dr Luiz Blare. Denies SSCP, palpitations, dyspnea or syncope. Compliant with meds. Needs f/U carotid duplex in 6 months. His EF has been hard to gage by echo but is likely 30-35%. He has been euvolemic with no CHF He is very depressed as his invalid wife has recently passed and frankly I don't think Jalynn knows what to do with himself Still seems very depressed. Sees [facility] for blook work and routine physical  Carotid 6/3/1`3 reviewed  0-39% bilateral disease Echo: 5/13    Left ventricle: Technically difficult study. LV dimensions are not obtained. The LV appears to be dilated and the ejection fraction appears to be in the range of 25%. Consider repeat study using contrast if clinically indicated. Images were inadequate for LV wall motion assessment. Doppler parameters are consistent with abnormal left ventricular relaxation (grade 1 diastolic dysfunction).  ------------------------------------------------------------ Aortic valve: Aortic valve gradients suggest mild aortic stenosis. However the clinical severity may be underestimated because of the poor LV systolic function. Severely thickened, moderately calcified leaflets. Doppler: VTI ratio of LVOT to aortic valve: 0.32. Indexed valve area: 0.69cm^2/m^2 (VTI). Peak velocity ratio of LVOT to aortic valve: 0.34. Indexed valve area: 0.73cm^2/m^2 (Vmax). Mean gradient: 12mm Hg (S). Peak gradient: 19mm Hg (S).  Long discussion with Lazer given his significant depression and his wife passing 2 years ago.  He does not want to be on a ventilator.  He is ok with being shocked Discussed his AS and DCM   He does not want furhter w/u for AICD or BiV pacer.  Paper work filled out for [employer]  ROS: Denies fever, malais, weight loss, blurry vision, decreased visual acuity, cough, sputum, SOB, hemoptysis, pleuritic pain, palpitaitons, heartburn, abdominal pain, melena, lower extremity edema, claudication, or rash.  All other systems reviewed and negative  General: Affect appropriate Healthy:  appears stated age HEENT: normal Neck supple with no adenopathy JVP normal left  bruits no thyromegaly Lungs clear with no wheezing and good diaphragmatic motion Heart:  S1/S2 SEM murmur, no rub, gallop or click PMI enlarged no bruit.  No HSM or HJR Distal pulses intact with no bruits No edema Neuro non-focal Skin warm and dry No muscular weakness   Current Outpatient Prescriptions  Medication Sig Dispense Refill  . aspirin 81 MG tablet Take 81 mg by mouth daily.        Marland Kitchen atorvastatin (LIPITOR) 20 MG tablet Take 20 mg by mouth daily.        . beta carotene 78295 UNIT capsule Take 25,000 Units by mouth daily.        . carvedilol (COREG) 3.125 MG tablet Take 3.125 mg by mouth 2 (two) times daily with a meal.        . Flaxseed, Linseed, (FLAXSEED OIL PO) Take 2,400 mg by mouth daily.        . Garlic Oil 1000 MG CAPS Take 1 capsule by mouth daily.        Marland Kitchen glimepiride (AMARYL) 4 MG tablet Take 4 mg by mouth daily before breakfast.        . metFORMIN (GLUMETZA) 500 MG (MOD) 24 hr  tablet Take 500 mg by mouth 2 (two) times daily with a meal.        . Omega-3 Fatty Acids (FISH OIL) 500 MG CAPS Take 1 capsule by mouth 2 (two) times daily.        . ramipril (ALTACE) 5 MG capsule Take 5 mg by mouth daily.        . Saw Palmetto 450 MG CAPS Take 1 capsule by mouth daily.          Allergies  Review of patient's allergies indicates no known allergies.  Electrocardiogram:  NSR rate 82 LAD IVCD  Assessment and Plan

## 2011-10-01 NOTE — Assessment & Plan Note (Signed)
A low fat, low cholesterol is discussed with the patient, and a written copy is given to him.

## 2011-10-01 NOTE — Patient Instructions (Signed)
Your physician wants you to follow-up in:  6 MONTHS WITH DR NISHAN  You will receive a reminder letter in the mail two months in advance. If you don't receive a letter, please call our office to schedule the follow-up appointment. Your physician recommends that you continue on your current medications as directed. Please refer to the Current Medication list given to you today. 

## 2011-10-01 NOTE — Assessment & Plan Note (Signed)
Stable with no angina and good activity level.  Continue medical Rx  

## 2011-10-01 NOTE — Assessment & Plan Note (Signed)
Discussed low carb diet.  Target hemoglobin A1c is 6.5 or less.  Continue current medications.  

## 2011-10-01 NOTE — Assessment & Plan Note (Signed)
Functional class one.  Would normally do cardiac MRI and refer to EPS.  Patient indicates DNI and no AICD.  This is consistant with long term fatalism and depression.Continue current meds

## 2011-10-01 NOTE — Assessment & Plan Note (Signed)
No critical disease by duplex.  F/U 2 years  ASA

## 2011-10-01 NOTE — Assessment & Plan Note (Signed)
Well controlled.  Continue current medications and low sodium Dash type diet.    

## 2011-10-01 NOTE — Assessment & Plan Note (Signed)
More likely moderate with decreased EF Not a candidiate for surgery.  May be a candidate for palliative ballon valve but not likely

## 2012-07-03 ENCOUNTER — Encounter: Payer: Self-pay | Admitting: Cardiovascular Disease

## 2012-07-03 ENCOUNTER — Ambulatory Visit (INDEPENDENT_AMBULATORY_CARE_PROVIDER_SITE_OTHER): Payer: Medicare Other | Admitting: Cardiovascular Disease

## 2012-07-03 VITALS — BP 139/70 | HR 62 | Wt 170.0 lb

## 2012-07-03 DIAGNOSIS — E119 Type 2 diabetes mellitus without complications: Secondary | ICD-10-CM

## 2012-07-03 DIAGNOSIS — E785 Hyperlipidemia, unspecified: Secondary | ICD-10-CM

## 2012-07-03 DIAGNOSIS — I10 Essential (primary) hypertension: Secondary | ICD-10-CM

## 2012-07-03 DIAGNOSIS — I359 Nonrheumatic aortic valve disorder, unspecified: Secondary | ICD-10-CM

## 2012-07-03 DIAGNOSIS — Z951 Presence of aortocoronary bypass graft: Secondary | ICD-10-CM

## 2012-07-03 DIAGNOSIS — R0989 Other specified symptoms and signs involving the circulatory and respiratory systems: Secondary | ICD-10-CM

## 2012-07-03 NOTE — Patient Instructions (Signed)
Your physician wants you to follow-up in:  6 MONTHS WITH DR NISHAN  You will receive a reminder letter in the mail two months in advance. If you don't receive a letter, please call our office to schedule the follow-up appointment. Your physician recommends that you continue on your current medications as directed. Please refer to the Current Medication list given to you today. 

## 2012-07-03 NOTE — Assessment & Plan Note (Signed)
Stable with no angina and good activity level.  Continue medical Rx  

## 2012-07-03 NOTE — Assessment & Plan Note (Signed)
No change in murmur Does not want aggressive evaluation of this

## 2012-07-03 NOTE — Assessment & Plan Note (Signed)
Well controlled.  Continue current medications and low sodium Dash type diet.    

## 2012-07-03 NOTE — Assessment & Plan Note (Signed)
No sig disease by duplex 2013  0-39%  Consider f/u in 2 years

## 2012-07-03 NOTE — Assessment & Plan Note (Signed)
Cholesterol is at goal.  Continue current dose of statin and diet Rx.  No myalgias or side effects.  F/U  LFT's in 6 months. No results found for this basename: LDLCALC             

## 2012-07-03 NOTE — Progress Notes (Signed)
Patient ID: Antonio Wilson, male   DOB: 05-May-1929, 79 y.o.   MRN: [id number] [facility] is seen today for F/U of CAD with prev. CABG in 2000. Known occlusion of RCA graft with stenting of the native distal RCA in 2010. Also history of mild AS and left carotid bruit. CRF's include elevated lipids and HTN. No cardiac complaints. Recent bilateral toe surgery by Dr Luiz Blare. Denies SSCP, palpitations, dyspnea or syncope. Compliant with meds. Needs f/U carotid duplex in 6 months. His EF has been hard to gage by echo but is likely 30-35%. He has been euvolemic with no CHF He is very depressed as his invalid wife has recently passed and frankly I don't think Cardarius knows what to do with himself Still seems very depressed. Sees [facility] for blook work and routine physical   Has a son in [place] that he talks with   Carotid 6/3/1`3 reviewed 0-39% bilateral disease  Echo: 5/13  Left ventricle: Technically difficult study. LV dimensions are not obtained. The LV appears to be dilated and the ejection fraction appears to be in the range of 25%. Consider repeat study using contrast if clinically indicated. Images were inadequate for LV wall motion assessment. Doppler parameters are consistent with abnormal left ventricular relaxation (grade 1 diastolic dysfunction).  ------------------------------------------------------------ Aortic valve: Aortic valve gradients suggest mild aortic stenosis. However the clinical severity may be underestimated because of the poor LV systolic function. Severely thickened, moderately calcified leaflets. Doppler: VTI ratio of LVOT to aortic valve: 0.32. Indexed valve area: 0.69cm^2/m^2 (VTI). Peak velocity ratio of LVOT to aortic valve: 0.34. Indexed valve area: 0.73cm^2/m^2 (Vmax). Mean gradient: 12mm Hg (S). Peak gradient: 19mm Hg (S).  Long discussion with Hani given his significant depression and his wife passing 2 years ago. He does not want to be on a ventilator. He is ok  with being shocked  Discussed his AS and DCM He does not want furhter w/u for AICD or BiV pacer.   ROS: Denies fever, malais, weight loss, blurry vision, decreased visual acuity, cough, sputum, SOB, hemoptysis, pleuritic pain, palpitaitons, heartburn, abdominal pain, melena, lower extremity edema, claudication, or rash.  All other systems reviewed and negative  General: Affect depressed Healthy:  appears stated age HEENT: normal Neck supple with no adenopathy JVP normal bilateral  bruits no thyromegaly Lungs clear with no wheezing and good diaphragmatic motion Heart:  S1/S2  AS  murmur, no rub, gallop or click PMI normal Abdomen: benighn, BS positve, no tenderness, no AAA no bruit.  No HSM or HJR Distal pulses intact with no bruits No edema Neuro non-focal Skin warm and dry No muscular weakness   Current Outpatient Prescriptions  Medication Sig Dispense Refill  . aspirin 81 MG tablet Take 81 mg by mouth daily.        Marland Kitchen atorvastatin (LIPITOR) 20 MG tablet Take 20 mg by mouth daily.        . beta carotene 40981 UNIT capsule Take 25,000 Units by mouth daily.        . carvedilol (COREG) 3.125 MG tablet Take 3.125 mg by mouth 2 (two) times daily with a meal.        . Flaxseed, Linseed, (FLAXSEED OIL PO) Take 2,400 mg by mouth daily.        . Garlic Oil 1000 MG CAPS Take 1 capsule by mouth daily.        Marland Kitchen glimepiride (AMARYL) 4 MG tablet Take 4 mg by mouth daily before breakfast.        .  metFORMIN (GLUMETZA) 500 MG (MOD) 24 hr tablet Take 500 mg by mouth 2 (two) times daily with a meal.        . Omega-3 Fatty Acids (FISH OIL) 500 MG CAPS Take 1 capsule by mouth 2 (two) times daily.        . ramipril (ALTACE) 5 MG capsule Take 5 mg by mouth daily.        . Saw Palmetto 450 MG CAPS Take 1 capsule by mouth daily.         No current facility-administered medications for this visit.    Allergies  Review of patient's allergies indicates no known allergies.  Electrocardiogram:  10/01/11  SR rate 82 LAD   Assessment and Plan

## 2012-07-03 NOTE — Assessment & Plan Note (Signed)
Discussed low carb diet.  Target hemoglobin A1c is 6.5 or less.  Continue current medications.  

## 2013-09-22 ENCOUNTER — Other Ambulatory Visit (HOSPITAL_COMMUNITY): Payer: Self-pay | Admitting: Cardiology

## 2013-09-22 DIAGNOSIS — I6529 Occlusion and stenosis of unspecified carotid artery: Secondary | ICD-10-CM

## 2013-10-01 ENCOUNTER — Ambulatory Visit (HOSPITAL_COMMUNITY): Payer: Medicare Other | Attending: Internal Medicine | Admitting: Cardiology

## 2013-10-01 DIAGNOSIS — I6529 Occlusion and stenosis of unspecified carotid artery: Secondary | ICD-10-CM

## 2013-10-01 DIAGNOSIS — I658 Occlusion and stenosis of other precerebral arteries: Secondary | ICD-10-CM | POA: Insufficient documentation

## 2013-10-01 DIAGNOSIS — I10 Essential (primary) hypertension: Secondary | ICD-10-CM | POA: Insufficient documentation

## 2013-10-01 DIAGNOSIS — E119 Type 2 diabetes mellitus without complications: Secondary | ICD-10-CM | POA: Insufficient documentation

## 2013-10-01 DIAGNOSIS — I2510 Atherosclerotic heart disease of native coronary artery without angina pectoris: Secondary | ICD-10-CM | POA: Insufficient documentation

## 2013-10-01 DIAGNOSIS — Z951 Presence of aortocoronary bypass graft: Secondary | ICD-10-CM | POA: Insufficient documentation

## 2013-10-01 DIAGNOSIS — I739 Peripheral vascular disease, unspecified: Secondary | ICD-10-CM | POA: Insufficient documentation

## 2013-10-01 NOTE — Progress Notes (Signed)
Carotid duplex completed 

## 2014-10-04 ENCOUNTER — Other Ambulatory Visit: Payer: Self-pay | Admitting: Cardiovascular Disease

## 2014-10-04 DIAGNOSIS — I6523 Occlusion and stenosis of bilateral carotid arteries: Secondary | ICD-10-CM

## 2014-10-11 ENCOUNTER — Ambulatory Visit (HOSPITAL_COMMUNITY): Payer: Medicare Other | Attending: Cardiology

## 2014-10-11 DIAGNOSIS — I6523 Occlusion and stenosis of bilateral carotid arteries: Secondary | ICD-10-CM | POA: Insufficient documentation

## 2014-10-22 ENCOUNTER — Encounter (HOSPITAL_COMMUNITY): Payer: Self-pay | Admitting: Emergency Medicine

## 2014-10-22 ENCOUNTER — Emergency Department (HOSPITAL_COMMUNITY)
Admission: EM | Admit: 2014-10-22 | Discharge: 2014-10-22 | Disposition: A | Discharge status: Home / Self Care | Payer: Medicare Other | Attending: Emergency Medicine | Admitting: Emergency Medicine

## 2014-10-22 DIAGNOSIS — Z951 Presence of aortocoronary bypass graft: Secondary | ICD-10-CM | POA: Insufficient documentation

## 2014-10-22 DIAGNOSIS — E11649 Type 2 diabetes mellitus with hypoglycemia without coma: Secondary | ICD-10-CM | POA: Insufficient documentation

## 2014-10-22 DIAGNOSIS — I10 Essential (primary) hypertension: Secondary | ICD-10-CM | POA: Insufficient documentation

## 2014-10-22 DIAGNOSIS — Z7982 Long term (current) use of aspirin: Secondary | ICD-10-CM | POA: Insufficient documentation

## 2014-10-22 DIAGNOSIS — Z79899 Other long term (current) drug therapy: Secondary | ICD-10-CM | POA: Insufficient documentation

## 2014-10-22 DIAGNOSIS — Z8739 Personal history of other diseases of the musculoskeletal system and connective tissue: Secondary | ICD-10-CM | POA: Insufficient documentation

## 2014-10-22 DIAGNOSIS — E780 Pure hypercholesterolemia: Secondary | ICD-10-CM | POA: Diagnosis not present

## 2014-10-22 DIAGNOSIS — I252 Old myocardial infarction: Secondary | ICD-10-CM | POA: Insufficient documentation

## 2014-10-22 DIAGNOSIS — E782 Mixed hyperlipidemia: Secondary | ICD-10-CM | POA: Insufficient documentation

## 2014-10-22 DIAGNOSIS — E162 Hypoglycemia, unspecified: Secondary | ICD-10-CM

## 2014-10-22 LAB — COMPREHENSIVE METABOLIC PANEL
ALT: 34 U/L (ref 17–63)
ANION GAP: 7 (ref 5–15)
AST: 61 U/L — ABNORMAL HIGH (ref 15–41)
Albumin: 3.5 g/dL (ref 3.5–5.0)
Alkaline Phosphatase: 60 U/L (ref 38–126)
BILIRUBIN TOTAL: 1.4 mg/dL — AB (ref 0.3–1.2)
BUN: 23 mg/dL — ABNORMAL HIGH (ref 6–20)
CALCIUM: 9.1 mg/dL (ref 8.9–10.3)
CO2: 22 mmol/L (ref 22–32)
Chloride: 104 mmol/L (ref 101–111)
Creatinine, Ser: 1.17 mg/dL (ref 0.61–1.24)
GFR calc Af Amer: >60 mL/min (ref >60)
GFR, EST NON AFRICAN AMERICAN: 56 mL/min — AB (ref >60)
GLUCOSE: 193 mg/dL — AB (ref 65–99)
Potassium: 5.2 mmol/L — ABNORMAL HIGH (ref 3.5–5.1)
Sodium: 133 mmol/L — ABNORMAL LOW (ref 135–145)
Total Protein: 5.9 g/dL — ABNORMAL LOW (ref 6.5–8.1)

## 2014-10-22 LAB — CBC
HEMATOCRIT: 35 % — AB (ref 39.0–52.0)
HEMOGLOBIN: 12.3 g/dL — AB (ref 13.0–17.0)
MCH: 32.2 pg (ref 26.0–34.0)
MCHC: 35.1 g/dL (ref 30.0–36.0)
MCV: 91.6 fL (ref 78.0–100.0)
PLATELETS: 123 10*3/uL — AB (ref 150–400)
RBC: 3.82 MIL/uL — AB (ref 4.22–5.81)
RDW: 15.3 % (ref 11.5–15.5)
WBC: 5.5 10*3/uL (ref 4.0–10.5)

## 2014-10-22 LAB — URINALYSIS, ROUTINE W REFLEX MICROSCOPIC
BILIRUBIN URINE: NEGATIVE
GLUCOSE, UA: NEGATIVE mg/dL
KETONES UR: NEGATIVE mg/dL
NITRITE: NEGATIVE
Protein, ur: 30 mg/dL — AB
SPECIFIC GRAVITY, URINE: 1.018 (ref 1.005–1.030)
UROBILINOGEN UA: 0.2 mg/dL (ref 0.0–1.0)
pH: 5.5 (ref 5.0–8.0)

## 2014-10-22 LAB — URINE MICROSCOPIC-ADD ON

## 2014-10-22 LAB — CBG MONITORING, ED: Glucose-Capillary: 186 mg/dL — ABNORMAL HIGH (ref 65–99)

## 2014-10-22 NOTE — ED Provider Notes (Signed)
CSN: [id number]     Arrival date & time 10/22/14  1930 History   First MD Initiated Contact with Patient 10/22/14 2004     Chief Complaint  Patient presents with  . Hypoglycemia     (Consider location/radiation/quality/duration/timing/severity/associated sxs/prior Treatment) HPI  Pt presenting with c/o hypoglycemia.  Per patient he states he has not been eating as much as usual and has not been eating regularly.  Pt was found with bs 49 and given IM glucagon by EMS.  A similar event happened yesterday.  Pt denies fever, no vomiting, no chest pain or lightheadedness.  He states his metformin was increased from 500 to 850 several months ago.  He states that he really did not want to come to the ED, states he already knows what the problem is and that after the blood sugar going low yesterday- he took the same dose of metformin again today "to prove my point".  Pt initially declined having any testing or further evaluation done, but finally agreed to basic blood work and urine.  He currently feels at his baseline and has no ongoing symptoms.   There are no other associated systemic symptoms, there are no other alleviating or modifying factors.   Past Medical History  Diagnosis Date  . V tach   . Hx of CABG   . Aortic stenosis, mild   . Ischemic cardiomyopathy   . Hyperlipidemia, mixed   . Hypercholesterolemia   . MI (myocardial infarction)   . HTN (hypertension)   . DM (diabetes mellitus)   . Chronic osteomyelitis involving lower leg   . Osteomyelitis     of great right toe   Past Surgical History  Procedure Laterality Date  . Coronary artery bypass graft  2000    by Dr. Donata Clay  . Cardiac catheterization  05/2001  . Foot amputation through metatarsal     Family History  Problem Relation Age of Onset  . Cancer Father    History  Substance Use Topics  . Smoking status: Never Smoker   . Smokeless tobacco: Not on file  . Alcohol Use: No    Review of Systems  ROS reviewed and  all otherwise negative except for mentioned in HPI    Allergies  Review of patient's allergies indicates no known allergies.  Home Medications   Prior to Admission medications   Medication Sig Start Date End Date Taking? Authorizing Provider  aspirin 81 MG tablet Take 81 mg by mouth daily.     Yes Historical Provider, MD  atorvastatin (LIPITOR) 20 MG tablet Take 20 mg by mouth daily.     Yes Historical Provider, MD  beta carotene 40981 UNIT capsule Take 25,000 Units by mouth daily.     Yes Historical Provider, MD  carvedilol (COREG) 3.125 MG tablet Take 3.125 mg by mouth 2 (two) times daily with a meal.     Yes Historical Provider, MD  Flaxseed, Linseed, (FLAXSEED OIL PO) Take 2,400 mg by mouth daily.     Yes Historical Provider, MD  Garlic Oil 1000 MG CAPS Take 1 capsule by mouth daily.     Yes Historical Provider, MD  glimepiride (AMARYL) 4 MG tablet Take 4 mg by mouth daily before breakfast.     Yes Historical Provider, MD  metFORMIN (GLUCOPHAGE) 850 MG tablet Take 850 mg by mouth 2 (two) times daily. 08/30/14  Yes Historical Provider, MD  Omega-3 Fatty Acids (FISH OIL) 500 MG CAPS Take 1 capsule by mouth 2 (two) times  daily.     Yes Historical Provider, MD  ramipril (ALTACE) 5 MG capsule Take 5 mg by mouth daily.     Yes Historical Provider, MD  Saw Palmetto 450 MG CAPS Take 1 capsule by mouth daily.     Yes Historical Provider, MD   BP 161/88 mmHg  Pulse 78  Temp(Src) 97.7 F (36.5 C)  Resp 16  Ht 5' 11.5" (1.816 m)  Wt 160 lb (72.576 kg)  BMI 22.01 kg/m2  SpO2 98%  Vitals reviewed Physical Exam  Physical Examination: General appearance - alert, chronically ill appearing, and in no distress Mental status - alert, oriented to person, place, and time Eyes - no conjunctival injection, no scleral icterus Mouth - mucous membranes moist, pharynx normal without lesions Chest - clear to auscultation, no wheezes, rales or rhonchi, symmetric air entry Heart - normal rate, regular  rhythm, normal S1, S2, no murmurs, rubs, clicks or gallops Abdomen - soft, nontender, nondistended, no masses or organomegaly Extremities - peripheral pulses normal, no pedal edema, no clubbing or cyanosis Skin - normal coloration and turgor, no rashes  ED Course  Procedures (including critical care time) Labs Review Labs Reviewed  CBC - Abnormal; Notable for the following:    RBC 3.82 (*)    Hemoglobin 12.3 (*)    HCT 35.0 (*)    Platelets 123 (*)    All other components within normal limits  COMPREHENSIVE METABOLIC PANEL - Abnormal; Notable for the following:    Sodium 133 (*)    Potassium 5.2 (*)    Glucose, Bld 193 (*)    BUN 23 (*)    Total Protein 5.9 (*)    AST 61 (*)    Total Bilirubin 1.4 (*)    GFR calc non Af Amer 56 (*)    All other components within normal limits  URINALYSIS, ROUTINE W REFLEX MICROSCOPIC (NOT [facility]) - Abnormal; Notable for the following:    Hgb urine dipstick SMALL (*)    Protein, ur 30 (*)    Leukocytes, UA SMALL (*)    All other components within normal limits  URINE MICROSCOPIC-ADD ON - Abnormal; Notable for the following:    Casts HYALINE CASTS (*)    All other components within normal limits  CBG MONITORING, ED - Abnormal; Notable for the following:    Glucose-Capillary 186 (*)    All other components within normal limits  CBG MONITORING, ED    Imaging Review No results found.   EKG Interpretation None      MDM   Final diagnoses:  Hypoglycemia    Pt presenting after episode of hypoglycemia.  He states he feels back to his baseline now.  He initially did not want to come to the ED and did not want any testing done.  I was able to convince him to have basic labs and urine done.  However he is very concerned about being able to leave.  He has no acute abnormalities on his workup. He is requesting to be discharged from the ED.  I have adivsed him to eat frequent small meals and check his blood glucose frequently at home.   Also  advised close followup with his PMD.  Discharged with strict return precautions.  Pt agreeable with plan.    Jerelyn ScottMartha Linker, MD 10/23/14 [phone]

## 2014-10-22 NOTE — ED Notes (Signed)
Pt arrives from home via EMS, PCP changed metformin from 500 to 850. Found on floor with CBG 49. IM shot glucagon. Same event occurred yesterday.

## 2014-10-22 NOTE — ED Notes (Signed)
CBG 186 

## 2014-10-22 NOTE — Discharge Instructions (Signed)
Return to the ED with any concerns including fainting, difficulty breathing, seizure activity, vomiting and not able to keep down liquids or your medications, decreased level of alertness/lethargy, or any other alarming symptoms  It is very important that you eat small frequent meals and do not skip meals.

## 2015-01-03 ENCOUNTER — Encounter (HOSPITAL_COMMUNITY): Payer: Self-pay | Admitting: Emergency Medicine

## 2015-01-03 ENCOUNTER — Emergency Department (HOSPITAL_COMMUNITY)
Admission: EM | Admit: 2015-01-03 | Discharge: 2015-01-03 | Disposition: A | Discharge status: Home / Self Care | Payer: Medicare Other | Attending: Emergency Medicine | Admitting: Emergency Medicine

## 2015-01-03 ENCOUNTER — Emergency Department (HOSPITAL_COMMUNITY): Payer: Medicare Other

## 2015-01-03 DIAGNOSIS — W1839XA Other fall on same level, initial encounter: Secondary | ICD-10-CM | POA: Diagnosis not present

## 2015-01-03 DIAGNOSIS — Z7982 Long term (current) use of aspirin: Secondary | ICD-10-CM | POA: Diagnosis not present

## 2015-01-03 DIAGNOSIS — I252 Old myocardial infarction: Secondary | ICD-10-CM | POA: Diagnosis not present

## 2015-01-03 DIAGNOSIS — R140 Abdominal distension (gaseous): Secondary | ICD-10-CM | POA: Insufficient documentation

## 2015-01-03 DIAGNOSIS — Z79899 Other long term (current) drug therapy: Secondary | ICD-10-CM | POA: Diagnosis not present

## 2015-01-03 DIAGNOSIS — Z951 Presence of aortocoronary bypass graft: Secondary | ICD-10-CM | POA: Insufficient documentation

## 2015-01-03 DIAGNOSIS — Z9889 Other specified postprocedural states: Secondary | ICD-10-CM | POA: Insufficient documentation

## 2015-01-03 DIAGNOSIS — E162 Hypoglycemia, unspecified: Secondary | ICD-10-CM

## 2015-01-03 DIAGNOSIS — E11649 Type 2 diabetes mellitus with hypoglycemia without coma: Secondary | ICD-10-CM | POA: Insufficient documentation

## 2015-01-03 DIAGNOSIS — Y998 Other external cause status: Secondary | ICD-10-CM | POA: Diagnosis not present

## 2015-01-03 DIAGNOSIS — Y9389 Activity, other specified: Secondary | ICD-10-CM | POA: Diagnosis not present

## 2015-01-03 DIAGNOSIS — E785 Hyperlipidemia, unspecified: Secondary | ICD-10-CM | POA: Insufficient documentation

## 2015-01-03 DIAGNOSIS — S0990XA Unspecified injury of head, initial encounter: Secondary | ICD-10-CM | POA: Diagnosis not present

## 2015-01-03 DIAGNOSIS — Y9289 Other specified places as the place of occurrence of the external cause: Secondary | ICD-10-CM | POA: Insufficient documentation

## 2015-01-03 DIAGNOSIS — Z8739 Personal history of other diseases of the musculoskeletal system and connective tissue: Secondary | ICD-10-CM | POA: Insufficient documentation

## 2015-01-03 DIAGNOSIS — I10 Essential (primary) hypertension: Secondary | ICD-10-CM | POA: Diagnosis not present

## 2015-01-03 DIAGNOSIS — S3991XA Unspecified injury of abdomen, initial encounter: Secondary | ICD-10-CM | POA: Diagnosis present

## 2015-01-03 LAB — CBG MONITORING, ED
Glucose-Capillary: 18 mg/dL — CL (ref 65–99)
Glucose-Capillary: 64 mg/dL — ABNORMAL LOW (ref 65–99)
Glucose-Capillary: 66 mg/dL (ref 65–99)

## 2015-01-03 LAB — CBC WITH DIFFERENTIAL/PLATELET
Basophils Absolute: 0 10*3/uL (ref 0.0–0.1)
Basophils Relative: 0 % (ref 0–1)
Eosinophils Absolute: 0 10*3/uL (ref 0.0–0.7)
Eosinophils Relative: 0 % (ref 0–5)
HCT: 36.9 % — ABNORMAL LOW (ref 39.0–52.0)
Hemoglobin: 12.6 g/dL — ABNORMAL LOW (ref 13.0–17.0)
Lymphocytes Relative: 18 % (ref 12–46)
Lymphs Abs: 0.9 10*3/uL (ref 0.7–4.0)
MCH: 32.6 pg (ref 26.0–34.0)
MCHC: 34.1 g/dL (ref 30.0–36.0)
MCV: 95.3 fL (ref 78.0–100.0)
Monocytes Absolute: 0.6 10*3/uL (ref 0.1–1.0)
Monocytes Relative: 12 % (ref 3–12)
Neutro Abs: 3.4 10*3/uL (ref 1.7–7.7)
Neutrophils Relative %: 70 % (ref 43–77)
Platelets: 137 10*3/uL — ABNORMAL LOW (ref 150–400)
RBC: 3.87 MIL/uL — ABNORMAL LOW (ref 4.22–5.81)
RDW: 14.8 % (ref 11.5–15.5)
WBC: 4.9 10*3/uL (ref 4.0–10.5)

## 2015-01-03 LAB — COMPREHENSIVE METABOLIC PANEL
ALT: 34 U/L (ref 17–63)
AST: 64 U/L — ABNORMAL HIGH (ref 15–41)
Albumin: 3.4 g/dL — ABNORMAL LOW (ref 3.5–5.0)
Alkaline Phosphatase: 78 U/L (ref 38–126)
Anion gap: 6 (ref 5–15)
BUN: 44 mg/dL — ABNORMAL HIGH (ref 6–20)
CO2: 29 mmol/L (ref 22–32)
Calcium: 8.7 mg/dL — ABNORMAL LOW (ref 8.9–10.3)
Chloride: 104 mmol/L (ref 101–111)
Creatinine, Ser: 1.8 mg/dL — ABNORMAL HIGH (ref 0.61–1.24)
GFR calc Af Amer: 38 mL/min — ABNORMAL LOW (ref >60)
GFR calc non Af Amer: 33 mL/min — ABNORMAL LOW (ref >60)
Glucose, Bld: 42 mg/dL — CL (ref 65–99)
Potassium: 5.1 mmol/L (ref 3.5–5.1)
Sodium: 139 mmol/L (ref 135–145)
Total Bilirubin: 1.3 mg/dL — ABNORMAL HIGH (ref 0.3–1.2)
Total Protein: 6.1 g/dL — ABNORMAL LOW (ref 6.5–8.1)

## 2015-01-03 MED ORDER — SODIUM CHLORIDE 0.9 % IV BOLUS (SEPSIS)
1000.0000 mL | Freq: Once | INTRAVENOUS | Status: AC
  Administered 2015-01-03: 1000 mL via INTRAVENOUS

## 2015-01-03 MED ORDER — DEXTROSE 50 % IV SOLN
1.0000 | Freq: Once | INTRAVENOUS | Status: AC
  Administered 2015-01-03: 50 mL via INTRAVENOUS
  Filled 2015-01-03: qty 50

## 2015-01-03 MED ORDER — METFORMIN HCL 500 MG PO TABS: 500.0000 mg | ORAL_TABLET | Freq: Two times a day (BID) | ORAL | Status: AC

## 2015-01-03 NOTE — Discharge Instructions (Signed)
Keep a log of your blood glucose. Reduce your metformin to previous dosing of 500mg  twice a day. You need to follow-up with your PCP as soon as you can to discuss further.    Blood Glucose Monitoring Monitoring your blood glucose (also know as blood sugar) helps you to manage your diabetes. It also helps you and your health care provider monitor your diabetes and determine how well your treatment plan is working. WHY SHOULD YOU MONITOR YOUR BLOOD GLUCOSE?  It can help you understand how food, exercise, and medicine affect your blood glucose.  It allows you to know what your blood glucose is at any given moment. You can quickly tell if you are having low blood glucose (hypoglycemia) or high blood glucose (hyperglycemia).  It can help you and your health care provider know how to adjust your medicines.  It can help you understand how to manage an illness or adjust medicine for exercise. WHEN SHOULD YOU TEST? Your health care provider will help you decide how often you should check your blood glucose. This may depend on the type of diabetes you have, your diabetes control, or the types of medicines you are taking. Be sure to write down all of your blood glucose readings so that this information can be reviewed with your health care provider. See below for examples of testing times that your health care provider may suggest. Type 1 Diabetes  Test 4 times a day if you are in good control, using an insulin pump, or perform multiple daily injections.  If your diabetes is not well controlled or if you are sick, you may need to monitor more often.  It is a good idea to also monitor:  Before and after exercise.  Between meals and 2 hours after a meal.  Occasionally between 2:00 a.m. and 3:00 a.m. Type 2 Diabetes  It can vary with each person, but generally, if you are on insulin, test 4 times a day.  If you take medicines by mouth (orally), test 2 times a day.  If you are on a controlled  diet, test once a day.  If your diabetes is not well controlled or if you are sick, you may need to monitor more often. HOW TO MONITOR YOUR BLOOD GLUCOSE Supplies Needed  Blood glucose meter.  Test strips for your meter. Each meter has its own strips. You must use the strips that go with your own meter.  A pricking needle (lancet).  A device that holds the lancet (lancing device).  A journal or log book to write down your results. Procedure  Wash your hands with soap and water. Alcohol is not preferred.  Prick the side of your finger (not the tip) with the lancet.  Gently milk the finger until a small drop of blood appears.  Follow the instructions that come with your meter for inserting the test strip, applying blood to the strip, and using your blood glucose meter. Other Areas to Get Blood for Testing Some meters allow you to use other areas of your body (other than your finger) to test your blood. These areas are called alternative sites. The most common alternative sites are:  The forearm.  The thigh.  The back area of the lower leg.  The palm of the hand. The blood flow in these areas is slower. Therefore, the blood glucose values you get may be delayed, and the numbers are different from what you would get from your fingers. Do not use alternative sites if  you think you are having hypoglycemia. Your reading will not be accurate. Always use a finger if you are having hypoglycemia. Also, if you cannot feel your lows (hypoglycemia unawareness), always use your fingers for your blood glucose checks. ADDITIONAL TIPS FOR GLUCOSE MONITORING  Do not reuse lancets.  Always carry your supplies with you.  All blood glucose meters have a 24-hour "hotline" number to call if you have questions or need help.  Adjust (calibrate) your blood glucose meter with a control solution after finishing a few boxes of strips. BLOOD GLUCOSE RECORD KEEPING It is a good idea to keep a daily  record or log of your blood glucose readings. Most glucose meters, if not all, keep your glucose records stored in the meter. Some meters come with the ability to download your records to your home computer. Keeping a record of your blood glucose readings is especially helpful if you are wanting to look for patterns. Make notes to go along with the blood glucose readings because you might forget what happened at that exact time. Keeping good records helps you and your health care provider to work together to achieve good diabetes management.  Document Released: 04/12/2003 Document Revised: 08/24/2013 Document Reviewed: 09/01/2012 [facility] Patient Information 2015 [place], [place]. This information is not intended to replace advice given to you by your health care provider. Make sure you discuss any questions you have with your health care provider.  Hypoglycemia Hypoglycemia occurs when the glucose in your blood is too low. Glucose is a type of sugar that is your body's main energy source. Hormones, such as insulin and glucagon, control the level of glucose in the blood. Insulin lowers blood glucose and glucagon increases blood glucose. Having too much insulin in your blood stream, or not eating enough food containing sugar, can result in hypoglycemia. Hypoglycemia can happen to people with or without diabetes. It can develop quickly and can be a medical emergency.  CAUSES   Missing or delaying meals.  Not eating enough carbohydrates at meals.  Taking too much diabetes medicine.  Not timing your oral diabetes medicine or insulin doses with meals, snacks, and exercise.  Nausea and vomiting.  Certain medicines.  Severe illnesses, such as hepatitis, kidney disorders, and certain eating disorders.  Increased activity or exercise without eating something extra or adjusting medicines.  Drinking too much alcohol.  A nerve disorder that affects body functions like your heart rate, blood pressure,  and digestion (autonomic neuropathy).  A condition where the stomach muscles do not function properly (gastroparesis). Therefore, medicines and food may not absorb properly.  Rarely, a tumor of the pancreas can produce too much insulin. SYMPTOMS   Hunger.  Sweating (diaphoresis).  Change in body temperature.  Shakiness.  Headache.  Anxiety.  Lightheadedness.  Irritability.  Difficulty concentrating.  Dry mouth.  Tingling or numbness in the hands or feet.  Restless sleep or sleep disturbances.  Altered speech and coordination.  Change in mental status.  Seizures or prolonged convulsions.  Combativeness.  Drowsiness (lethargic).  Weakness.  Increased heart rate or palpitations.  Confusion.  Pale, gray skin color.  Blurred or double vision.  Fainting. DIAGNOSIS  A physical exam and medical history will be performed. Your caregiver may make a diagnosis based on your symptoms. Blood tests and other lab tests may be performed to confirm a diagnosis. Once the diagnosis is made, your caregiver will see if your signs and symptoms go away once your blood glucose is raised.  TREATMENT  Usually,  you can easily treat your hypoglycemia when you notice symptoms.  Check your blood glucose. If it is less than 70 mg/dl, take one of the following:   3-4 glucose tablets.    cup juice.    cup regular soda.   1 cup skim milk.   -1 tube of glucose gel.   5-6 hard candies.   Avoid high-fat drinks or food that may delay a rise in blood glucose levels.  Do not take more than the recommended amount of sugary foods, drinks, gel, or tablets. Doing so will cause your blood glucose to go too high.   Wait 10-15 minutes and recheck your blood glucose. If it is still less than 70 mg/dl or below your target range, repeat treatment.   Eat a snack if it is more than 1 hour until your next meal.  There may be a time when your blood glucose may go so low that you  are unable to treat yourself at home when you start to notice symptoms. You may need someone to help you. You may even faint or be unable to swallow. If you cannot treat yourself, someone will need to bring you to the hospital.  HOME CARE INSTRUCTIONS  If you have diabetes, follow your diabetes management plan by:  Taking your medicines as directed.  Following your exercise plan.  Following your meal plan. Do not skip meals. Eat on time.  Testing your blood glucose regularly. Check your blood glucose before and after exercise. If you exercise longer or different than usual, be sure to check blood glucose more frequently.  Wearing your medical alert jewelry that says you have diabetes.  Identify the cause of your hypoglycemia. Then, develop ways to prevent the recurrence of hypoglycemia.  Do not take a hot bath or shower right after an insulin shot.  Always carry treatment with you. Glucose tablets are the easiest to carry.  If you are going to drink alcohol, drink it only with meals.  Tell friends or family members ways to keep you safe during a seizure. This may include removing hard or sharp objects from the area or turning you on your side.  Maintain a healthy weight. SEEK MEDICAL CARE IF:   You are having problems keeping your blood glucose in your target range.  You are having frequent episodes of hypoglycemia.  You feel you might be having side effects from your medicines.  You are not sure why your blood glucose is dropping so low.  You notice a change in vision or a new problem with your vision. SEEK IMMEDIATE MEDICAL CARE IF:   Confusion develops.  A change in mental status occurs.  The inability to swallow develops.  Fainting occurs. Document Released: 04/09/2005 Document Revised: 04/14/2013 Document Reviewed: 08/06/2011 [facility] Patient Information 2015 [place], [place]. This information is not intended to replace advice given to you by your health care  provider. Make sure you discuss any questions you have with your health care provider.

## 2015-01-03 NOTE — ED Notes (Signed)
Pt has various complaints. Complained of abd pain to EMS. EMS noted abd seems hard. Pt denies any trouble with BM or urinating. Pt also states he fell last night and family got pt off the floor. Pt states he hit his head. EMS noted a fib on monitor, pt denies hx of afib. Also reports bilateral ankle edema.

## 2015-01-03 NOTE — ED Notes (Signed)
Bed: WA24 Expected date:  Expected time:  Means of arrival:  Comments: Ems- abdominal pain 

## 2015-01-03 NOTE — ED Notes (Signed)
MD at bedside. 

## 2015-01-03 NOTE — ED Notes (Signed)
Reminded pt of need for urine sample. Urinal at bedside. 

## 2015-01-03 NOTE — ED Notes (Signed)
Pt's family at bedside. Pt agreed to try eating applesauce and orange juice. Urinal at bedside

## 2015-01-03 NOTE — ED Provider Notes (Signed)
CSN: [id number]     Arrival date & time 01/03/15  1050 History   First MD Initiated Contact with Patient 01/03/15 1056     Chief Complaint  Patient presents with  . Abdominal Pain  . Fall     (Consider location/radiation/quality/duration/timing/severity/associated sxs/prior Treatment) HPI   85yM with fall last night and generalized weakness. Says he just went down. Did not lose consciousness. Had to be helped up by family. Continues to feel generally weak today. Some vague abdominal pain. Distended. No n/v/d. No urinary complaints. No fever or chills. Pt perseverating on diabetic medication and attributes recent symptoms to increase in metformin dosing.   Past Medical History  Diagnosis Date  . V tach   . Hx of CABG   . Aortic stenosis, mild   . Ischemic cardiomyopathy   . Hyperlipidemia, mixed   . Hypercholesterolemia   . MI (myocardial infarction)   . HTN (hypertension)   . DM (diabetes mellitus)   . Chronic osteomyelitis involving lower leg   . Osteomyelitis     of great right toe   Past Surgical History  Procedure Laterality Date  . Coronary artery bypass graft  2000    by Dr. Donata Clay  . Cardiac catheterization  05/2001  . Foot amputation through metatarsal     Family History  Problem Relation Age of Onset  . Cancer Father    Social History  Substance Use Topics  . Smoking status: Never Smoker   . Smokeless tobacco: None  . Alcohol Use: No    Review of Systems  All systems reviewed and negative, other than as noted in HPI.   Allergies  Review of patient's allergies indicates no known allergies.  Home Medications   Prior to Admission medications   Medication Sig Start Date End Date Taking? Authorizing Provider  aspirin 81 MG tablet Take 81 mg by mouth daily.     Yes Historical Provider, MD  atorvastatin (LIPITOR) 20 MG tablet Take 20 mg by mouth daily.     Yes Historical Provider, MD  beta carotene 86578 UNIT capsule Take 25,000 Units by mouth daily.      Yes Historical Provider, MD  carvedilol (COREG) 3.125 MG tablet Take 3.125 mg by mouth 2 (two) times daily with a meal.     Yes Historical Provider, MD  Flaxseed, Linseed, (FLAXSEED OIL PO) Take 2,400 mg by mouth daily.     Yes Historical Provider, MD  Garlic Oil 1000 MG CAPS Take 1 capsule by mouth daily.     Yes Historical Provider, MD  glimepiride (AMARYL) 4 MG tablet Take 4 mg by mouth daily before breakfast.     Yes Historical Provider, MD  metFORMIN (GLUCOPHAGE) 850 MG tablet Take 425 mg by mouth daily.  08/30/14  Yes Historical Provider, MD  Omega-3 Fatty Acids (FISH OIL) 500 MG CAPS Take 1 capsule by mouth 2 (two) times daily.     Yes Historical Provider, MD  prednisoLONE acetate (PRED FORTE) 1 % ophthalmic suspension Place 1 drop into both eyes daily.   Yes Historical Provider, MD  ramipril (ALTACE) 5 MG capsule Take 5 mg by mouth daily.     Yes Historical Provider, MD  Saw Palmetto 450 MG CAPS Take 1 capsule by mouth daily.     Yes Historical Provider, MD  Tetrahydroz-Polyvinyl Al-Povid (MURINE TEARS PLUS OP) Apply 1 drop to eye daily as needed (dry red eye).   Yes Historical Provider, MD  valACYclovir (VALTREX) 500 MG tablet Take 500  mg by mouth daily.   Yes Historical Provider, MD   BP 107/54 mmHg  Pulse 79  Temp(Src) 97.8 F (36.6 C) (Oral)  Resp 18  SpO2 100% Physical Exam  Constitutional: He is oriented to person, place, and time. He appears well-developed and well-nourished. No distress.  HENT:  Head: Normocephalic and atraumatic.  Eyes: Conjunctivae are normal. Right eye exhibits no discharge. Left eye exhibits no discharge.  Neck: Neck supple.  Cardiovascular: Normal rate, regular rhythm and normal heart sounds.  Exam reveals no gallop and no friction rub.   No murmur heard. Pulmonary/Chest: Effort normal and breath sounds normal. No respiratory distress.  Abdominal: Soft. He exhibits distension. There is no tenderness.  Distended, but soft and NT.   Musculoskeletal:  He exhibits no edema or tenderness.  Neurological: He is alert and oriented to person, place, and time. No cranial nerve deficit. He exhibits normal muscle tone. Coordination normal.  Skin: Skin is warm and dry.  Psychiatric: He has a normal mood and affect. His behavior is normal. Thought content normal.  Nursing note and vitals reviewed.   ED Course  Procedures (including critical care time) Labs Review Labs Reviewed  CBC WITH DIFFERENTIAL/PLATELET - Abnormal; Notable for the following:    RBC 3.87 (*)    Hemoglobin 12.6 (*)    HCT 36.9 (*)    Platelets 137 (*)    All other components within normal limits  COMPREHENSIVE METABOLIC PANEL - Abnormal; Notable for the following:    Glucose, Bld 42 (*)    BUN 44 (*)    Creatinine, Ser 1.80 (*)    Calcium 8.7 (*)    Total Protein 6.1 (*)    Albumin 3.4 (*)    AST 64 (*)    Total Bilirubin 1.3 (*)    GFR calc non Af Amer 33 (*)    GFR calc Af Amer 38 (*)    All other components within normal limits  URINALYSIS, ROUTINE W REFLEX MICROSCOPIC (NOT AT [facility])    Imaging Review Dg Abd 1 View  01/03/2015   CLINICAL DATA:  Abdominal distention  EXAM: ABDOMEN - 1 VIEW  COMPARISON:  None.  FINDINGS: There is moderate stool in the colon. There is no bowel dilatation or air-fluid level suggesting obstruction. No free air. There are small phleboliths in the pelvis. There is calcification in the splenic artery.  IMPRESSION: Bowel gas pattern unremarkable.  Moderate stool in colon.   Electronically Signed   By: Bretta Bang III M.D.   On: 01/03/2015 12:23   Ct Head Wo Contrast  01/03/2015   CLINICAL DATA:  Fall last evening hitting head.  Initial encounter.  EXAM: CT HEAD WITHOUT CONTRAST  TECHNIQUE: Contiguous axial images were obtained from the base of the skull through the vertex without intravenous contrast.  COMPARISON:  None.  FINDINGS: Mild likely age-appropriate atrophy with sulcal prominence and centralized volume loss and mild  commensurate ex vacuo dilatation of the ventricular system. Scattered periventricular hypodensities compatible microvascular ischemic disease. The gray-white differentiation is otherwise well maintained without CT evidence of acute large territory infarct. No intraparenchymal extra-axial mass or hemorrhage. Normal size and configuration of the ventricles and basilar cisterns. No midline shift. There is apparent under pneumatization of the right frontal and maxillary sinuses. There is age-indeterminate opacification right mastoid air cells. The remaining paranasal sinuses and left mastoid air cells are normally aerated. No air-fluid levels. Post bilateral cataract surgery. Regional soft tissues appear normal. No displaced calvarial fracture.  IMPRESSION: 1. Mild likely age-appropriate atrophy and microvascular disease without acute intracranial process. 2. Age-indeterminate right mastoid air cell effusion, nonspecific though could be seen in the setting of mastoiditis.   Electronically Signed   By: Simonne Come M.D.   On: 01/03/2015 12:28   I have personally reviewed and evaluated these images and lab results as part of my medical decision-making.   EKG Interpretation   Date/Time:  Monday January 03 2015 11:03:05 EDT Ventricular Rate:  81 PR Interval:  169 QRS Duration: 144 QT Interval:  404 QTC Calculation: 469 R Axis:   -63 Text Interpretation:  Unknown rhythm, irregular rate Left bundle branch  block ED PHYSICIAN INTERPRETATION AVAILABLE IN [facility] Confirmed  by TEST, Record ([id number]) on 01/04/2015 7:53:41 AM      MDM   Final diagnoses:  Hypoglycemia    85yM with fall last night and weakness. Symptoms seems to correspond to increasing metformin from 500mg  to 875. Also on glimepiride. Advised to hold both for next day. Reduce metformin back to 500mg . Detailed glucose log. PCP FU. EKG appears to be sinus with PACs. Denies dizziness or lightheadedness. Even with glucose of 18 in ED  today he was alert and oriented. Observed until stable. AKI consistent with dehydration which is likely contributing. Received a liter of NS. Reports feeling better. It has been determined that no acute conditions requiring further emergency intervention are present at this time. The patient has been advised of the diagnosis and plan. I reviewed any labs and imaging including any potential incidental findings. We have discussed signs and symptoms that warrant return to the ED and they are listed in the discharge instructions.      Raeford Razor, MD 01/18/15 1550

## 2015-01-03 NOTE — ED Notes (Signed)
Pt refuses to eat meal tray because he feels food gets stuck in chest. Offered applesauce and PO fluids. Pt refused.

## 2015-05-25 DEATH — deceased

## 2015-11-16 IMAGING — CT CT HEAD W/O CM
2 series · 16 of 30 positions shown, 19 images · non-contrast
Comparison: None.

CLINICAL DATA: Fall last evening hitting head.  Initial encounter.

EXAM:
CT HEAD WITHOUT CONTRAST
TECHNIQUE: Contiguous axial images were obtained from the base of the skull
through the vertex without intravenous contrast.

[Series 2: head w/o · axial · non-contrast · 0.42mm/px · z∈[+1672,+1792]mm · 9 of 31 slices shown, 12 images]
[im 4/31  brain]
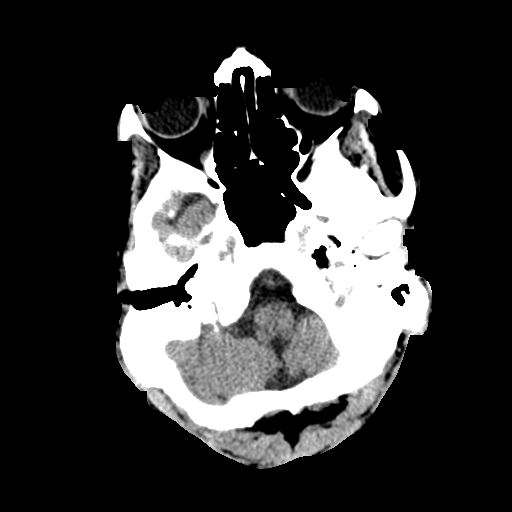
[im 4/31  bone]
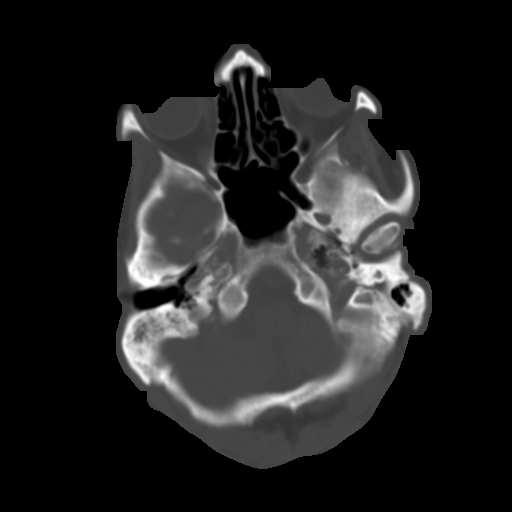
[im 7/31  brain]
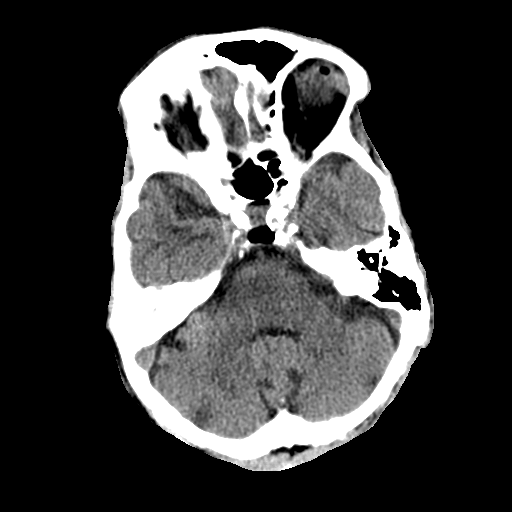
[im 10/31  brain]
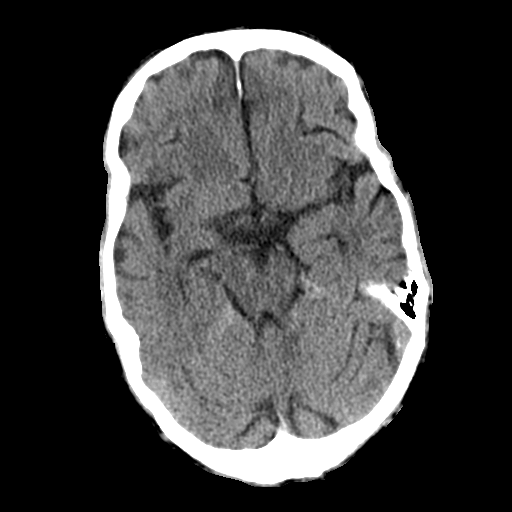
[im 13/31  brain]
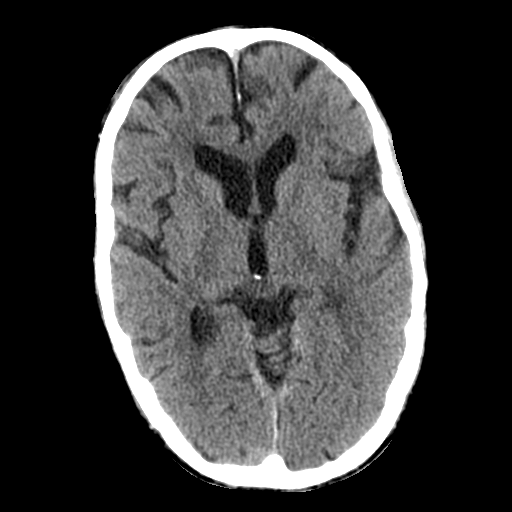
[im 16/31  brain]
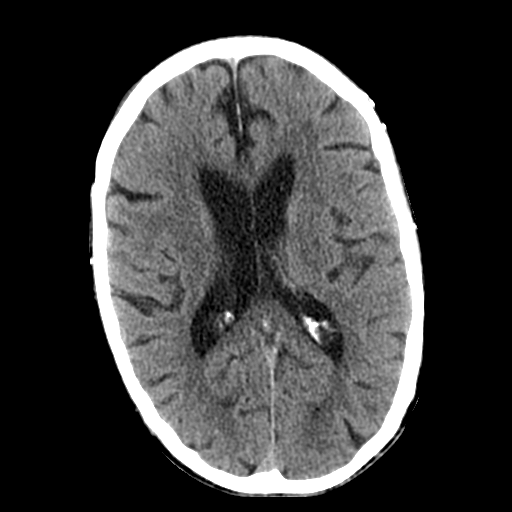
[im 16/31  bone]
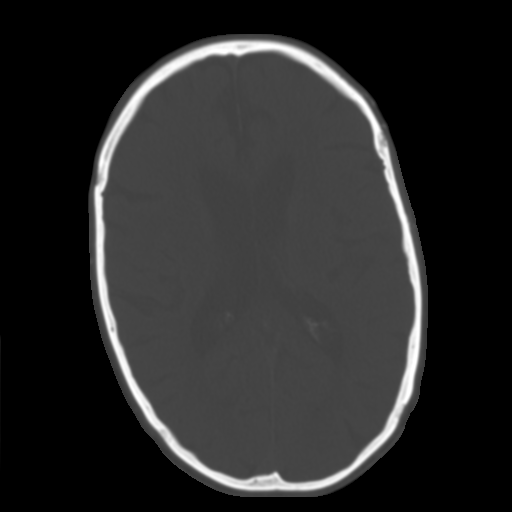
[im 19/31  brain]
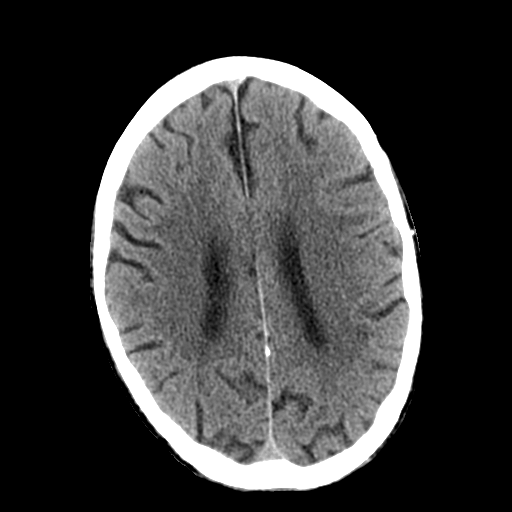
[im 22/31  brain]
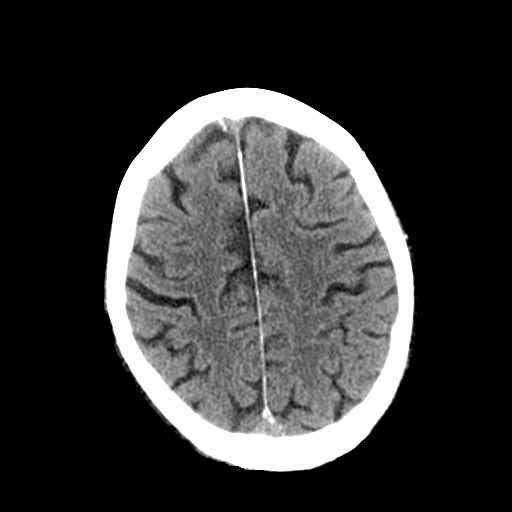
[im 25/31  brain]
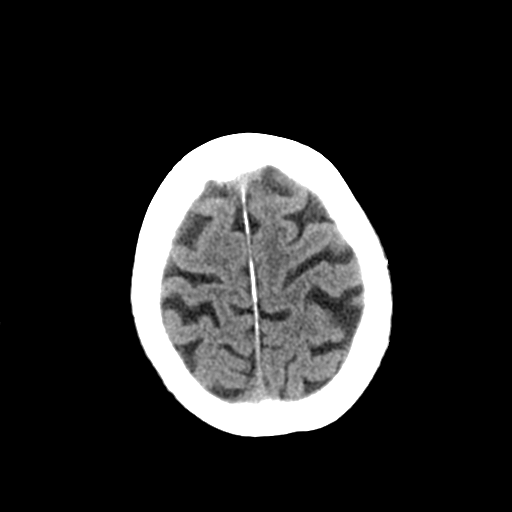
[im 28/31  brain]
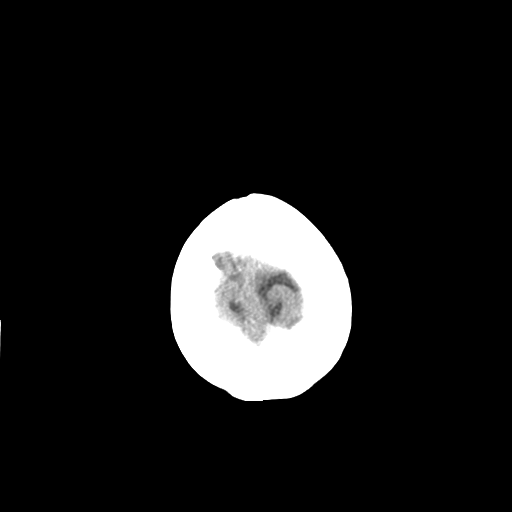
[im 28/31  bone]
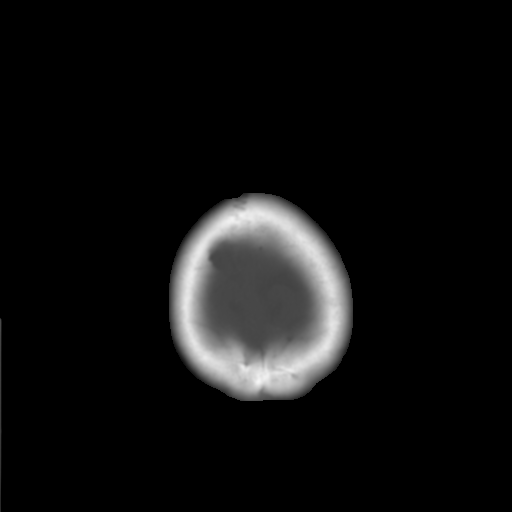

[Series 3: bone windows · axial · 0.42mm/px · z∈[+1672,+1771]mm · 7 of 51 slices shown]
[im 6/51  bone]
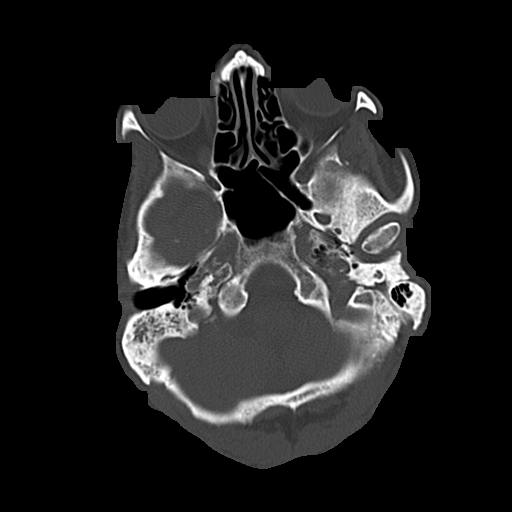
[im 12/51  bone]
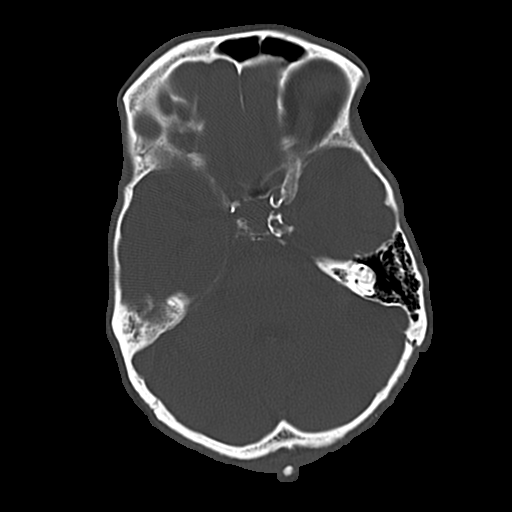
[im 17/51  bone]
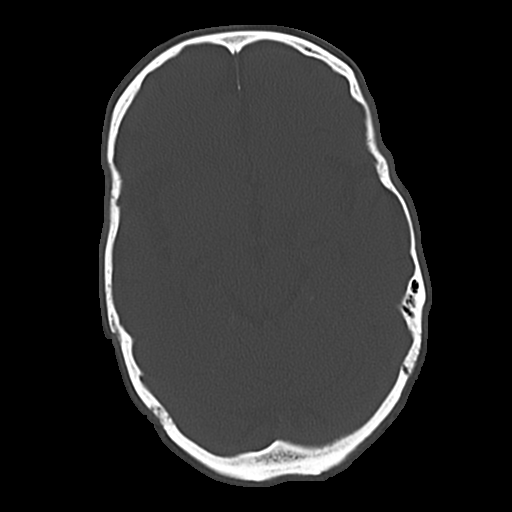
[im 23/51  bone]
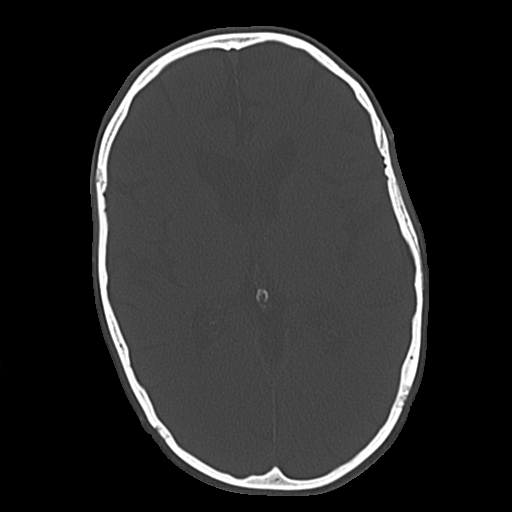
[im 28/51  bone]
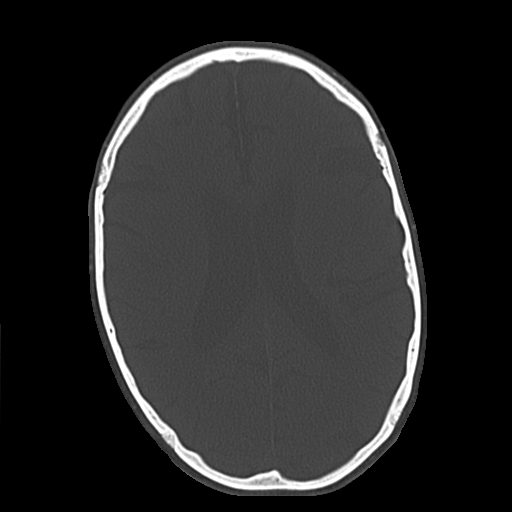
[im 34/51  bone]
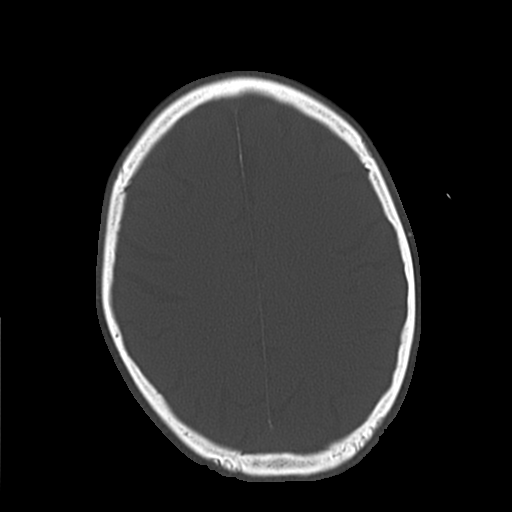
[im 39/51  bone]
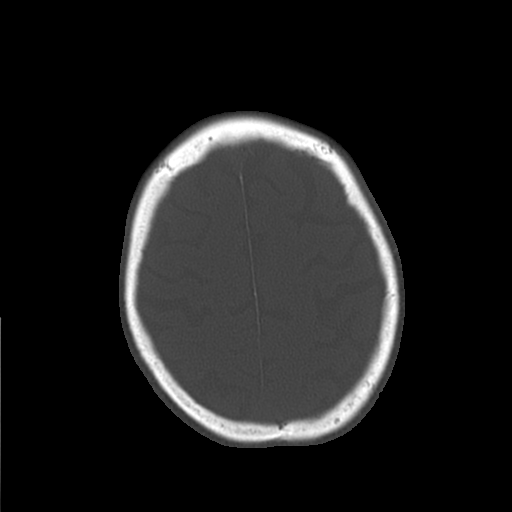

[16 of 30 positions shown; findings below may reference images not displayed]

FINDINGS: Mild likely age-appropriate atrophy with sulcal prominence and
centralized volume loss and mild commensurate ex vacuo dilatation of
the ventricular system. Scattered periventricular hypodensities
compatible microvascular ischemic disease. The gray-white
differentiation is otherwise well maintained without CT evidence of
acute large territory infarct. No intraparenchymal extra-axial mass
or hemorrhage. Normal size and configuration of the ventricles and
basilar cisterns. No midline shift. There is apparent under
pneumatization of the right frontal and maxillary sinuses. There is
age-indeterminate opacification right mastoid air cells. The
remaining paranasal sinuses and left mastoid air cells are normally
aerated. No air-fluid levels. Post bilateral cataract surgery.
Regional soft tissues appear normal. No displaced calvarial
fracture.
IMPRESSION: 1. Mild likely age-appropriate atrophy and microvascular disease
without acute intracranial process.
2. Age-indeterminate right mastoid air cell effusion, nonspecific
though could be seen in the setting of mastoiditis.
